# Patient Record
Sex: Female | Born: 1955 | Race: White | Hispanic: No | Smoking: Former smoker
Health system: Southern US, Community
[De-identification: ages and names within clinical notes are randomized; demographics above are authoritative.]

## PROBLEM LIST (undated history)

## (undated) DIAGNOSIS — K5909 Other constipation: Secondary | ICD-10-CM

## (undated) DIAGNOSIS — M159 Polyosteoarthritis, unspecified: Secondary | ICD-10-CM

## (undated) DIAGNOSIS — K219 Gastro-esophageal reflux disease without esophagitis: Secondary | ICD-10-CM

## (undated) DIAGNOSIS — K5790 Diverticulosis of intestine, part unspecified, without perforation or abscess without bleeding: Secondary | ICD-10-CM

## (undated) DIAGNOSIS — I10 Essential (primary) hypertension: Secondary | ICD-10-CM

## (undated) HISTORY — PX: NEPHRECTOMY: SHX65

## (undated) HISTORY — PX: KNEE ARTHROSCOPY: SUR90

## (undated) HISTORY — DX: Essential (primary) hypertension: I10

## (undated) HISTORY — DX: Other constipation: K59.09

## (undated) HISTORY — PX: FOOT SURGERY: SHX648

## (undated) HISTORY — PX: CHOLECYSTECTOMY: SHX55

## (undated) HISTORY — DX: Polyosteoarthritis, unspecified: M15.9

## (undated) HISTORY — DX: Gastro-esophageal reflux disease without esophagitis: K21.9

---

## 2002-09-30 ENCOUNTER — Other Ambulatory Visit: Admission: RE | Admit: 2002-09-30 | Discharge: 2002-09-30 | Payer: Self-pay | Admitting: Internal Medicine

## 2003-10-07 ENCOUNTER — Other Ambulatory Visit: Admission: RE | Admit: 2003-10-07 | Discharge: 2003-10-07 | Payer: Self-pay | Admitting: Internal Medicine

## 2005-11-08 ENCOUNTER — Ambulatory Visit: Payer: Self-pay | Admitting: Internal Medicine

## 2005-11-08 ENCOUNTER — Other Ambulatory Visit: Admission: RE | Admit: 2005-11-08 | Discharge: 2005-11-08 | Payer: Self-pay | Admitting: Internal Medicine

## 2005-11-21 ENCOUNTER — Ambulatory Visit: Payer: Self-pay | Admitting: Gastroenterology

## 2005-12-05 ENCOUNTER — Ambulatory Visit: Payer: Self-pay | Admitting: Gastroenterology

## 2007-06-16 ENCOUNTER — Telehealth (INDEPENDENT_AMBULATORY_CARE_PROVIDER_SITE_OTHER): Payer: Self-pay | Admitting: *Deleted

## 2007-09-25 HISTORY — PX: CHOLECYSTECTOMY: SHX55

## 2015-02-12 ENCOUNTER — Encounter (HOSPITAL_COMMUNITY): Payer: Self-pay | Admitting: *Deleted

## 2015-02-12 DIAGNOSIS — Z8659 Personal history of other mental and behavioral disorders: Secondary | ICD-10-CM | POA: Insufficient documentation

## 2015-02-12 DIAGNOSIS — R143 Flatulence: Secondary | ICD-10-CM | POA: Insufficient documentation

## 2015-02-12 DIAGNOSIS — Z87891 Personal history of nicotine dependence: Secondary | ICD-10-CM | POA: Insufficient documentation

## 2015-02-12 DIAGNOSIS — Z79899 Other long term (current) drug therapy: Secondary | ICD-10-CM | POA: Insufficient documentation

## 2015-02-12 DIAGNOSIS — R1084 Generalized abdominal pain: Secondary | ICD-10-CM | POA: Diagnosis present

## 2015-02-12 DIAGNOSIS — R112 Nausea with vomiting, unspecified: Secondary | ICD-10-CM | POA: Insufficient documentation

## 2015-02-12 DIAGNOSIS — K5900 Constipation, unspecified: Secondary | ICD-10-CM | POA: Insufficient documentation

## 2015-02-12 LAB — CBC WITH DIFFERENTIAL/PLATELET
Basophils Absolute: 0 10*3/uL (ref 0.0–0.1)
Basophils Relative: 0 % (ref 0–1)
Eosinophils Absolute: 0.1 10*3/uL (ref 0.0–0.7)
Eosinophils Relative: 1 % (ref 0–5)
HCT: 42.6 % (ref 36.0–46.0)
HEMOGLOBIN: 14.2 g/dL (ref 12.0–15.0)
Lymphocytes Relative: 17 % (ref 12–46)
Lymphs Abs: 1.5 10*3/uL (ref 0.7–4.0)
MCH: 30.3 pg (ref 26.0–34.0)
MCHC: 33.3 g/dL (ref 30.0–36.0)
MCV: 90.8 fL (ref 78.0–100.0)
Monocytes Absolute: 0.3 10*3/uL (ref 0.1–1.0)
Monocytes Relative: 3 % (ref 3–12)
NEUTROS ABS: 6.9 10*3/uL (ref 1.7–7.7)
NEUTROS PCT: 79 % — AB (ref 43–77)
PLATELETS: 216 10*3/uL (ref 150–400)
RBC: 4.69 MIL/uL (ref 3.87–5.11)
RDW: 12.6 % (ref 11.5–15.5)
WBC: 8.8 10*3/uL (ref 4.0–10.5)

## 2015-02-12 LAB — I-STAT CG4 LACTIC ACID, ED: Lactic Acid, Venous: 1.85 mmol/L (ref 0.5–2.0)

## 2015-02-12 NOTE — ED Notes (Signed)
Pt in c/o upper abd pain, reports LBM was Wednesday, reports n/v that started today

## 2015-02-13 ENCOUNTER — Emergency Department (HOSPITAL_COMMUNITY): Payer: PRIVATE HEALTH INSURANCE

## 2015-02-13 ENCOUNTER — Emergency Department (HOSPITAL_COMMUNITY)
Admission: EM | Admit: 2015-02-13 | Discharge: 2015-02-13 | Disposition: A | Discharge status: Home / Self Care | Payer: PRIVATE HEALTH INSURANCE | Attending: Emergency Medicine | Admitting: Emergency Medicine

## 2015-02-13 DIAGNOSIS — K5900 Constipation, unspecified: Secondary | ICD-10-CM

## 2015-02-13 DIAGNOSIS — R112 Nausea with vomiting, unspecified: Secondary | ICD-10-CM

## 2015-02-13 HISTORY — DX: Diverticulosis of intestine, part unspecified, without perforation or abscess without bleeding: K57.90

## 2015-02-13 LAB — COMPREHENSIVE METABOLIC PANEL
ALBUMIN: 3.8 g/dL (ref 3.5–5.0)
ALK PHOS: 152 U/L — AB (ref 38–126)
ALT: 57 U/L — ABNORMAL HIGH (ref 14–54)
ANION GAP: 12 (ref 5–15)
AST: 80 U/L — ABNORMAL HIGH (ref 15–41)
BUN: 10 mg/dL (ref 6–20)
CO2: 23 mmol/L (ref 22–32)
Calcium: 9.7 mg/dL (ref 8.9–10.3)
Chloride: 102 mmol/L (ref 101–111)
Creatinine, Ser: 0.83 mg/dL (ref 0.44–1.00)
GFR calc non Af Amer: >60 mL/min (ref >60)
GLUCOSE: 128 mg/dL — AB (ref 65–99)
Potassium: 4 mmol/L (ref 3.5–5.1)
Sodium: 137 mmol/L (ref 135–145)
TOTAL PROTEIN: 6.8 g/dL (ref 6.5–8.1)
Total Bilirubin: 0.7 mg/dL (ref 0.3–1.2)

## 2015-02-13 LAB — URINALYSIS, ROUTINE W REFLEX MICROSCOPIC
BILIRUBIN URINE: NEGATIVE
GLUCOSE, UA: NEGATIVE mg/dL
Hgb urine dipstick: NEGATIVE
Ketones, ur: NEGATIVE mg/dL
Leukocytes, UA: NEGATIVE
Nitrite: NEGATIVE
PH: 5.5 (ref 5.0–8.0)
Protein, ur: NEGATIVE mg/dL
Specific Gravity, Urine: 1.009 (ref 1.005–1.030)
UROBILINOGEN UA: 1 mg/dL (ref 0.0–1.0)

## 2015-02-13 LAB — I-STAT TROPONIN, ED: TROPONIN I, POC: 0 ng/mL (ref 0.00–0.08)

## 2015-02-13 LAB — LIPASE, BLOOD: Lipase: 29 U/L (ref 22–51)

## 2015-02-13 MED ORDER — ONDANSETRON 4 MG PO TBDP
4.0000 mg | ORAL_TABLET | Freq: Once | ORAL | Status: AC
  Administered 2015-02-13: 4 mg via ORAL
  Filled 2015-02-13: qty 1

## 2015-02-13 MED ORDER — TRAMADOL HCL 50 MG PO TABS
50.0000 mg | ORAL_TABLET | Freq: Once | ORAL | Status: AC
  Administered 2015-02-13: 50 mg via ORAL
  Filled 2015-02-13: qty 1

## 2015-02-13 MED ORDER — TRAMADOL HCL 50 MG PO TABS: 50.0000 mg | ORAL_TABLET | Freq: Four times a day (QID) | ORAL | Status: DC | PRN

## 2015-02-13 MED ORDER — ONDANSETRON 4 MG PO TBDP: 4.0000 mg | ORAL_TABLET | Freq: Three times a day (TID) | ORAL | Status: DC | PRN

## 2015-02-13 MED ORDER — DOCUSATE SODIUM 100 MG PO CAPS
100.0000 mg | ORAL_CAPSULE | Freq: Once | ORAL | Status: AC
  Administered 2015-02-13: 100 mg via ORAL
  Filled 2015-02-13: qty 1

## 2015-02-13 NOTE — ED Provider Notes (Signed)
TIME SEEN: 12:14 AM   CHIEF COMPLAINT: Abdominal Pain  HPI: HPI Comments: Alicia Wilcox is a 59 y.o. female with PMHx of diverticulosis who presents to the Emergency Department complaining of generalized abdominal pain onset this morning. Pt describes the pain as a pressure that gradually builds up. Pt reports associated nausea, vomiting, belching, flatus and constipation. Pt states that when she belches it smells like feces but has not vomited anything that looks like stool, no coffee-ground emesis, no hematemesis. Pt states that she has not had a normal BM in 3 days. Pt has tried 3 enemas and miralax  with no relief. Pt states that the pain is better when laying flat and is worse when sitting up. Pt denies fever or other related symptoms. Reports Hx or cholecystomy and Nephrectomy. Pt reports drinking alcohol 3-4 days a week. No heavy NSAID use. No bloody stool or melena.   ROS: See HPI Constitutional: no fever  Eyes: no drainage  ENT: no runny nose   Cardiovascular:  no chest pain  Resp: no SOB  GI: Nausea, vomiting, belching, flatus and constipation.  GU: no dysuria Integumentary: no rash  Allergy: no hives  Musculoskeletal: no leg swelling  Neurological: no slurred speech ROS otherwise negative  PAST MEDICAL HISTORY/PAST SURGICAL HISTORY:  Past Medical History  Diagnosis Date  . Diverticulosis   . Depression     MEDICATIONS:  Prior to Admission medications   Not on File    ALLERGIES:  Allergies  Allergen Reactions  . Vicodin [Hydrocodone-Acetaminophen]     SOCIAL HISTORY:  History  Substance Use Topics  . Smoking status: Former Games developer  . Smokeless tobacco: Not on file  . Alcohol Use: Not on file    FAMILY HISTORY: History reviewed. No pertinent family history.  EXAM: BP 131/91 mmHg  Pulse 104  Temp(Src) 98.7 F (37.1 C) (Oral)  Resp 20  Wt 190 lb (86.183 kg)  SpO2 100%   CONSTITUTIONAL: Alert and oriented and responds appropriately to questions.  Well-appearing; well-nourished HEAD: Normocephalic EYES: Conjunctivae clear, PERRL ENT: normal nose; no rhinorrhea; moist mucous membranes; pharynx without lesions noted NECK: Supple, no meningismus, no LAD  CARD: RRR; S1 and S2 appreciated; no murmurs, no clicks, no rubs, no gallops RESP: Normal chest excursion without splinting or tachypnea; breath sounds clear and equal bilaterally; no wheezes, no rhonchi, no rales, no hypoxia or respiratory distress, speaking full sentences ABD/GI: Normal bowel sounds; non-distended; soft, mildly tender to palpation diffusely, no rebound, no guarding, no peritoneal signs. Rectal: non tender rectal exam. No gross blood or melena, normal rectal tone, no fecal impaction.  BACK:  The back appears normal and is non-tender to palpation, there is no CVA tenderness EXT: Normal ROM in all joints; non-tender to palpation; no edema; normal capillary refill; no cyanosis, no calf tenderness or swelling    SKIN: Normal color for age and race; warm NEURO: Moves all extremities equally, sensation to light touch intact diffusely, cranial nerves II through XII intact PSYCH: The patient's mood and manner are appropriate. Grooming and personal hygiene are appropriate.   EKG Interpretation  Date/Time:  Saturday Feb 12 2015 23:30:43 EDT Ventricular Rate:  84 PR Interval:  160 QRS Duration: 80 QT Interval:  362 QTC Calculation: 427 R Axis:   119 Text Interpretation:  Normal sinus rhythm Left posterior fascicular block Cannot rule out Inferior infarct , age undetermined Anterior infarct , age undetermined Abnormal ECG No old tracing to compare Confirmed by Clent Damore,  DO, Brynlynn Walko [phone])  on 02/13/2015 12:27:01 AM       MEDICAL DECISION MAKING: Patient has diffuse mild abdominal pain but is hemodynamically stable. No peritoneal signs. Show mild elevation of her AST, ALT and alkaline phosphatase but she is a regular drinker. Denies Tylenol use. Is status post cholecystectomy. No  significant tenderness in the right upper quadrant. No leukocytosis. Normal lactate. Negative troponin. Acute abdominal series shows no free air or sign of bowel obstruction but she does have constipation. She is not impacted on rectal exam. Have advised her to continue increased fiber intake, water, Colace, MiraLAX. Given dose of tramadol and Zofran in the emergency department she reports feeling much better. We'll discharge with prescription for the same but advised her to use tramadol cautiously as this can worsen her constipation. Discussed return precautions. She verbalized understanding and is comfortable with plan. I do not feel she needs further abdominal imaging today as I have very low suspicion for any life-threatening, emergent process given she is well-appearing with benign exam and normal workup today.    I personally performed the services described in this documentation, which was scribed in my presence. The recorded information has been reviewed and is accurate.   Layla MawKristen N Giulio Bertino, DO 02/13/15 [phone]

## 2015-02-13 NOTE — ED Notes (Signed)
Patient transported to X-ray 

## 2015-02-13 NOTE — Discharge Instructions (Signed)
Your labs today were normal other than slightly elevated liver function tests. I recommend that you have avoid alcohol and acetaminophen (Tylenol) at this time and have this rechecked by primary care physician in the next 1-2 weeks. Your x-ray showed that you're constipated. I recommend you increase your water and fiber intake. If you cannot increase the fiber in your diet I recommend you use Metamucil daily. You may also use MiraLAX once a day and Colace 100 mg tablets twice a day for constipation.   Constipation Constipation is when a person has fewer than three bowel movements a week, has difficulty having a bowel movement, or has stools that are dry, hard, or larger than normal. As people grow older, constipation is more common. If you try to fix constipation with medicines that make you have a bowel movement (laxatives), the problem may get worse. Long-term laxative use may cause the muscles of the colon to become weak. A low-fiber diet, not taking in enough fluids, and taking certain medicines may make constipation worse.  CAUSES   Certain medicines, such as antidepressants, pain medicine, iron supplements, antacids, and water pills.   Certain diseases, such as diabetes, irritable bowel syndrome (IBS), thyroid disease, or depression.   Not drinking enough water.   Not eating enough fiber-rich foods.   Stress or travel.   Lack of physical activity or exercise.   Ignoring the urge to have a bowel movement.   Using laxatives too much.  SIGNS AND SYMPTOMS   Having fewer than three bowel movements a week.   Straining to have a bowel movement.   Having stools that are hard, dry, or larger than normal.   Feeling full or bloated.   Pain in the lower abdomen.   Not feeling relief after having a bowel movement.  DIAGNOSIS  Your health care provider will take a medical history and perform a physical exam. Further testing may be done for severe constipation. Some tests may  include:  A barium enema X-ray to examine your rectum, colon, and, sometimes, your small intestine.   A sigmoidoscopy to examine your lower colon.   A colonoscopy to examine your entire colon. TREATMENT  Treatment will depend on the severity of your constipation and what is causing it. Some dietary treatments include drinking more fluids and eating more fiber-rich foods. Lifestyle treatments may include regular exercise. If these diet and lifestyle recommendations do not help, your health care provider may recommend taking over-the-counter laxative medicines to help you have bowel movements. Prescription medicines may be prescribed if over-the-counter medicines do not work.  HOME CARE INSTRUCTIONS   Eat foods that have a lot of fiber, such as fruits, vegetables, whole grains, and beans.  Limit foods high in fat and processed sugars, such as french fries, hamburgers, cookies, candies, and soda.   A fiber supplement may be added to your diet if you cannot get enough fiber from foods.   Drink enough fluids to keep your urine clear or pale yellow.   Exercise regularly or as directed by your health care provider.   Go to the restroom when you have the urge to go. Do not hold it.   Only take over-the-counter or prescription medicines as directed by your health care provider. Do not take other medicines for constipation without talking to your health care provider first.  SEEK IMMEDIATE MEDICAL CARE IF:   You have bright red blood in your stool.   Your constipation lasts for more than 4 days or gets  worse.   You have abdominal or rectal pain.   You have thin, pencil-like stools.   You have unexplained weight loss. MAKE SURE YOU:   Understand these instructions.  Will watch your condition.  Will get help right away if you are not doing well or get worse. Document Released: 06/08/2004 Document Revised: 09/15/2013 Document Reviewed: 06/22/2013 [facility] Patient  Information 2015 [place], [place]. This information is not intended to replace advice given to you by your health care provider. Make sure you discuss any questions you have with your health care provider.   High-Fiber Diet Fiber is found in fruits, vegetables, and grains. A high-fiber diet encourages the addition of more whole grains, legumes, fruits, and vegetables in your diet. The recommended amount of fiber for adult males is 38 g per day. For adult females, it is 25 g per day. Pregnant and lactating women should get 28 g of fiber per day. If you have a digestive or bowel problem, ask your caregiver for advice before adding high-fiber foods to your diet. Eat a variety of high-fiber foods instead of only a select few type of foods.  PURPOSE  To increase stool bulk.  To make bowel movements more regular to prevent constipation.  To lower cholesterol.  To prevent overeating. WHEN IS THIS DIET USED?  It may be used if you have constipation and hemorrhoids.  It may be used if you have uncomplicated diverticulosis (intestine condition) and irritable bowel syndrome.  It may be used if you need help with weight management.  It may be used if you want to add it to your diet as a protective measure against atherosclerosis, diabetes, and cancer. SOURCES OF FIBER  Whole-grain breads and cereals.  Fruits, such as apples, oranges, bananas, berries, prunes, and pears.  Vegetables, such as green peas, carrots, sweet potatoes, beets, broccoli, cabbage, spinach, and artichokes.  Legumes, such split peas, soy, lentils.  Almonds. FIBER CONTENT IN FOODS Starches and Grains / Dietary Fiber (g)  Cheerios, 1 cup / 3 g  Corn Flakes cereal, 1 cup / 0.7 g  Rice crispy treat cereal, 1 cup / 0.3 g  Instant oatmeal (cooked),  cup / 2 g  Frosted wheat cereal, 1 cup / 5.1 g  Brown, long-grain rice (cooked), 1 cup / 3.5 g  White, long-grain rice (cooked), 1 cup / 0.6 g  Enriched macaroni  (cooked), 1 cup / 2.5 g Legumes / Dietary Fiber (g)  Baked beans (canned, plain, or vegetarian),  cup / 5.2 g  Kidney beans (canned),  cup / 6.8 g  Pinto beans (cooked),  cup / 5.5 g Breads and Crackers / Dietary Fiber (g)  Plain or honey graham crackers, 2 squares / 0.7 g  Saltine crackers, 3 squares / 0.3 g  Plain, salted pretzels, 10 pieces / 1.8 g  Whole-wheat bread, 1 slice / 1.9 g  White bread, 1 slice / 0.7 g  Raisin bread, 1 slice / 1.2 g  Plain bagel, 3 oz / 2 g  Flour tortilla, 1 oz / 0.9 g  Corn tortilla, 1 small / 1.5 g  Hamburger or hotdog bun, 1 small / 0.9 g Fruits / Dietary Fiber (g)  Apple with skin, 1 medium / 4.4 g  Sweetened applesauce,  cup / 1.5 g  Banana,  medium / 1.5 g  Grapes, 10 grapes / 0.4 g  Orange, 1 small / 2.3 g  Raisin, 1.5 oz / 1.6 g  Melon, 1 cup / 1.4 g Vegetables /  Dietary Fiber (g)  Green beans (canned),  cup / 1.3 g  Carrots (cooked),  cup / 2.3 g  Broccoli (cooked),  cup / 2.8 g  Peas (cooked),  cup / 4.4 g  Mashed potatoes,  cup / 1.6 g  Lettuce, 1 cup / 0.5 g  Corn (canned),  cup / 1.6 g  Tomato,  cup / 1.1 g Document Released: 09/10/2005 Document Revised: 03/11/2012 Document Reviewed: 12/13/2011 [employer] Patient Information 2015 [employer]. This information is not intended to replace advice given to you by your health care provider. Make sure you discuss any questions you have with your health care provider.   Nausea and Vomiting Nausea is a sick feeling that often comes before throwing up (vomiting). Vomiting is a reflex where stomach contents come out of your mouth. Vomiting can cause severe loss of body fluids (dehydration). Children and elderly adults can become dehydrated quickly, especially if they also have diarrhea. Nausea and vomiting are symptoms of a condition or disease. It is important to find the cause of your symptoms. CAUSES   Direct irritation of the stomach lining. This  irritation can result from increased acid production (gastroesophageal reflux disease), infection, food poisoning, taking certain medicines (such as nonsteroidal anti-inflammatory drugs), alcohol use, or tobacco use.  Signals from the brain.These signals could be caused by a headache, heat exposure, an inner ear disturbance, increased pressure in the brain from injury, infection, a tumor, or a concussion, pain, emotional stimulus, or metabolic problems.  An obstruction in the gastrointestinal tract (bowel obstruction).  Illnesses such as diabetes, hepatitis, gallbladder problems, appendicitis, kidney problems, cancer, sepsis, atypical symptoms of a heart attack, or eating disorders.  Medical treatments such as chemotherapy and radiation.  Receiving medicine that makes you sleep (general anesthetic) during surgery. DIAGNOSIS Your caregiver may ask for tests to be done if the problems do not improve after a few days. Tests may also be done if symptoms are severe or if the reason for the nausea and vomiting is not clear. Tests may include:  Urine tests.  Blood tests.  Stool tests.  Cultures (to look for evidence of infection).  X-rays or other imaging studies. Test results can help your caregiver make decisions about treatment or the need for additional tests. TREATMENT You need to stay well hydrated. Drink frequently but in small amounts.You may wish to drink water, sports drinks, clear broth, or eat frozen ice pops or gelatin dessert to help stay hydrated.When you eat, eating slowly may help prevent nausea.There are also some antinausea medicines that may help prevent nausea. HOME CARE INSTRUCTIONS   Take all medicine as directed by your caregiver.  If you do not have an appetite, do not force yourself to eat. However, you must continue to drink fluids.  If you have an appetite, eat a normal diet unless your caregiver tells you differently.  Eat a variety of complex carbohydrates  (rice, wheat, potatoes, bread), lean meats, yogurt, fruits, and vegetables.  Avoid high-fat foods because they are more difficult to digest.  Drink enough water and fluids to keep your urine clear or pale yellow.  If you are dehydrated, ask your caregiver for specific rehydration instructions. Signs of dehydration may include:  Severe thirst.  Dry lips and mouth.  Dizziness.  Dark urine.  Decreasing urine frequency and amount.  Confusion.  Rapid breathing or pulse. SEEK IMMEDIATE MEDICAL CARE IF:   You have blood or brown flecks (like coffee grounds) in your vomit.  You have black  or bloody stools.  You have a severe headache or stiff neck.  You are confused.  You have severe abdominal pain.  You have chest pain or trouble breathing.  You do not urinate at least once every 8 hours.  You develop cold or clammy skin.  You continue to vomit for longer than 24 to 48 hours.  You have a fever. MAKE SURE YOU:   Understand these instructions.  Will watch your condition.  Will get help right away if you are not doing well or get worse. Document Released: 09/10/2005 Document Revised: 12/03/2011 Document Reviewed: 02/07/2011 [facility] Patient Information 2015 [place], [place]. This information is not intended to replace advice given to you by your health care provider. Make sure you discuss any questions you have with your health care provider.

## 2016-10-31 ENCOUNTER — Encounter (HOSPITAL_COMMUNITY): Payer: Self-pay | Admitting: Family Medicine

## 2016-10-31 ENCOUNTER — Ambulatory Visit (HOSPITAL_COMMUNITY)
Admission: EM | Admit: 2016-10-31 | Discharge: 2016-10-31 | Disposition: A | Discharge status: Home / Self Care | Payer: PRIVATE HEALTH INSURANCE | Attending: Family Medicine | Admitting: Family Medicine

## 2016-10-31 DIAGNOSIS — R21 Rash and other nonspecific skin eruption: Secondary | ICD-10-CM | POA: Diagnosis not present

## 2016-10-31 DIAGNOSIS — L509 Urticaria, unspecified: Secondary | ICD-10-CM | POA: Diagnosis not present

## 2016-10-31 MED ORDER — PREDNISONE 50 MG PO TABS: ORAL_TABLET | ORAL | 0 refills | Status: DC

## 2016-10-31 MED ORDER — DEXAMETHASONE SODIUM PHOSPHATE 10 MG/ML IJ SOLN
10.0000 mg | Freq: Once | INTRAMUSCULAR | Status: AC
  Administered 2016-10-31: 10 mg via INTRAMUSCULAR

## 2016-10-31 MED ORDER — METHYLPREDNISOLONE SODIUM SUCC 125 MG IJ SOLR
80.0000 mg | Freq: Once | INTRAMUSCULAR | Status: AC
  Administered 2016-10-31: 125 mg via INTRAMUSCULAR

## 2016-10-31 MED ORDER — DEXAMETHASONE SODIUM PHOSPHATE 10 MG/ML IJ SOLN
INTRAMUSCULAR | Status: AC
  Filled 2016-10-31: qty 1

## 2016-10-31 MED ORDER — METHYLPREDNISOLONE SODIUM SUCC 125 MG IJ SOLR
INTRAMUSCULAR | Status: AC
  Filled 2016-10-31: qty 2

## 2016-10-31 NOTE — ED Triage Notes (Signed)
Pt here for rash all over body. Itching and unsure what its from. Possible allergic reaction.

## 2016-10-31 NOTE — ED Provider Notes (Signed)
CSN: [id number]     Arrival date & time 10/31/16  1122 History   First MD Initiated Contact with Patient 10/31/16 1255     Chief Complaint  Patient presents with  . Rash   (Consider location/radiation/quality/duration/timing/severity/associated sxs/prior Treatment) 61 year old female complaining of a highly pruritic papular rash that developed yesterday. She is itching all over. Her rash is primarily to the trunk and proximal lower extremities arms and lesser to the face. She does not know what she may be allergic to that is causing this reaction. Her allergy to hydrocodone is not a true allergy it just causes nausea. She is unaware of any new medications. Denies difficulty in breathing, cough, swelling.      Past Medical History:  Diagnosis Date  . Depression   . Diverticulosis    History reviewed. No pertinent surgical history. History reviewed. No pertinent family history. Social History  Substance Use Topics  . Smoking status: Former Games developermoker  . Smokeless tobacco: Never Used  . Alcohol use Not on file   OB History    No data available     Review of Systems  Constitutional: Negative for activity change and fever.  HENT: Negative.   Eyes: Negative.   Respiratory: Negative for cough, choking, shortness of breath, wheezing and stridor.   Cardiovascular: Negative for chest pain and leg swelling.  Gastrointestinal: Negative.   Skin: Positive for rash.  Neurological: Negative.   All other systems reviewed and are negative.   Allergies  Vicodin [hydrocodone-acetaminophen]  Home Medications   Prior to Admission medications   Medication Sig Start Date End Date Taking? Authorizing Provider  omeprazole (PRILOSEC OTC) 20 MG tablet Take 20 mg by mouth daily.    Historical Provider, MD  ondansetron (ZOFRAN ODT) 4 MG disintegrating tablet Take 1 tablet (4 mg total) by mouth every 8 (eight) hours as needed for nausea or vomiting. 02/13/15   Kristen N Ward, DO  PARoxetine (PAXIL) 40  MG tablet Take 40 mg by mouth daily.    Historical Provider, MD  traMADol (ULTRAM) 50 MG tablet Take 1 tablet (50 mg total) by mouth every 6 (six) hours as needed. 02/13/15   Layla MawKristen N Ward, DO   Meds Ordered and Administered this Visit   Medications  dexamethasone (DECADRON) injection 10 mg (not administered)  methylPREDNISolone sodium succinate (SOLU-MEDROL) 125 mg/2 mL injection 80 mg (not administered)    BP 150/78   Pulse 93   Temp 98.4 F (36.9 C)   Resp 18   SpO2 98%  No data found.   Physical Exam  Constitutional: She is oriented to person, place, and time. She appears well-developed and well-nourished. No distress.  HENT:  Head: Normocephalic and atraumatic.  Nose: Nose normal.  Mouth/Throat: Oropharynx is clear and moist. No oropharyngeal exudate.  Airway widely patent area and no intraoral edema or discoloration.  Eyes: Conjunctivae and EOM are normal. Pupils are equal, round, and reactive to light.  Neck: Neck supple. No tracheal deviation present.  Cardiovascular: Normal rate.   Pulmonary/Chest: Effort normal and breath sounds normal. No respiratory distress. She has no wheezes. She has no rales.  Musculoskeletal: Normal range of motion. She exhibits no edema or tenderness.  Lymphadenopathy:    She has no cervical adenopathy.  Neurological: She is alert and oriented to person, place, and time. No cranial nerve deficit.  Skin: Skin is warm and dry. No rash noted.  Primarily papular rash to the torso, upper extremities and proximal lower extremities. Densely populated to  the trunk. Few areas that appear more like urticaria.  Psychiatric: She has a normal mood and affect. Her behavior is normal. Thought content normal.  Nursing note and vitals reviewed.   Urgent Care Course     Procedures (including critical care time)  Labs Review Labs Reviewed - No data to display  Imaging Review No results found.   Visual Acuity Review  Right Eye Distance:   Left Eye  Distance:   Bilateral Distance:    Right Eye Near:   Left Eye Near:    Bilateral Near:         MDM   1. Rash   2. Hives    The rash certainly appears to be a type of allergic reaction. Remainder of your exam is normal. He had received injections of a short and long-acting steroid in the urgent care. He will receive a prescription for some oral prednisone to start taking tomorrow once daily for the next few days. Also she can take Benadryl in the evening. Otherwise take Zantac 150 mg twice a day and during the day he may also take Zyrtec or Allegra which are nondrowsy antihistamines. If he develops swelling of the tongue, throat, wheezing, trouble breathing, severe cough, fever or other problems this could represent an emergency and you would need to call 911 or go to the emergency department. Meds ordered this encounter  Medications  . dexamethasone (DECADRON) injection 10 mg  . methylPREDNISolone sodium succinate (SOLU-MEDROL) 125 mg/2 mL injection 80 mg  . predniSONE (DELTASONE) 50 MG tablet    Sig: 1 tab po daily for 6 days. Take with food.    Dispense:  6 tablet    Refill:  0    Order Specific Question:   Supervising Provider    Answer:   Lonia Blood       Hayden Rasmussen, NP 10/31/16 1316

## 2016-10-31 NOTE — Discharge Instructions (Signed)
The rash certainly appears to be a type of allergic reaction. Remainder of your exam is normal. He had received injections of a short and long-acting steroid in the urgent care. He will receive a prescription for some oral prednisone to start taking tomorrow once daily for the next few days. Also she can take Benadryl in the evening. Otherwise take Zantac 150 mg twice a day and during the day he may also take Zyrtec or Allegra which are nondrowsy antihistamines. If he develops swelling of the tongue, throat, wheezing, trouble breathing, severe cough, fever or other problems this could represent an emergency and you would need to call 911 or go to the emergency department.

## 2018-12-13 ENCOUNTER — Other Ambulatory Visit: Payer: Self-pay

## 2018-12-13 ENCOUNTER — Encounter (HOSPITAL_COMMUNITY): Payer: Self-pay | Admitting: *Deleted

## 2018-12-13 ENCOUNTER — Telehealth: Payer: Managed Care, Other (non HMO) | Admitting: Physician Assistant

## 2018-12-13 ENCOUNTER — Ambulatory Visit (HOSPITAL_COMMUNITY)
Admission: EM | Admit: 2018-12-13 | Discharge: 2018-12-13 | Disposition: A | Discharge status: Home / Self Care | Payer: Managed Care, Other (non HMO) | Attending: Physician Assistant | Admitting: Physician Assistant

## 2018-12-13 DIAGNOSIS — R21 Rash and other nonspecific skin eruption: Secondary | ICD-10-CM

## 2018-12-13 LAB — CBC
HEMATOCRIT: 40.6 % (ref 36.0–46.0)
HEMOGLOBIN: 13.2 g/dL (ref 12.0–15.0)
MCH: 29.5 pg (ref 26.0–34.0)
MCHC: 32.5 g/dL (ref 30.0–36.0)
MCV: 90.8 fL (ref 80.0–100.0)
NRBC: 0 % (ref 0.0–0.2)
Platelets: 223 10*3/uL (ref 150–400)
RBC: 4.47 MIL/uL (ref 3.87–5.11)
RDW: 11.7 % (ref 11.5–15.5)
WBC: 7 10*3/uL (ref 4.0–10.5)

## 2018-12-13 LAB — COMPREHENSIVE METABOLIC PANEL
ALK PHOS: 139 U/L — AB (ref 38–126)
ALT: 30 U/L (ref 0–44)
AST: 37 U/L (ref 15–41)
Albumin: 4.3 g/dL (ref 3.5–5.0)
Anion gap: 9 (ref 5–15)
BILIRUBIN TOTAL: 0.6 mg/dL (ref 0.3–1.2)
BUN: 12 mg/dL (ref 8–23)
CALCIUM: 9.8 mg/dL (ref 8.9–10.3)
CO2: 25 mmol/L (ref 22–32)
CREATININE: 1.03 mg/dL — AB (ref 0.44–1.00)
Chloride: 104 mmol/L (ref 98–111)
GFR calc Af Amer: >60 mL/min (ref >60)
GFR calc non Af Amer: 57 mL/min — ABNORMAL LOW (ref >60)
GLUCOSE: 107 mg/dL — AB (ref 70–99)
POTASSIUM: 4 mmol/L (ref 3.5–5.1)
Sodium: 138 mmol/L (ref 135–145)
TOTAL PROTEIN: 7.5 g/dL (ref 6.5–8.1)

## 2018-12-13 MED ORDER — AMLODIPINE BESYLATE 2.5 MG PO TABS: 2.5000 mg | ORAL_TABLET | Freq: Every day | ORAL | 0 refills | Status: DC

## 2018-12-13 MED ORDER — CLINDAMYCIN PHOSPHATE 1 % EX GEL: Freq: Two times a day (BID) | CUTANEOUS | 0 refills | Status: DC

## 2018-12-13 MED ORDER — TERBINAFINE HCL 250 MG PO TABS: 250.0000 mg | ORAL_TABLET | Freq: Every day | ORAL | 0 refills | Status: DC

## 2018-12-13 NOTE — ED Triage Notes (Signed)
C/O various sized red lesions to torso x 1 month.  Has tried mutliple OTC remedies without relief.

## 2018-12-13 NOTE — Discharge Instructions (Addendum)
I think this is either fungal or a bacteria that causes erythrasma. Start the fungal meds and cream. Follow up with Dr. Duard Larsen when you can.  I will call you if there is any major problems with your labs.

## 2018-12-13 NOTE — ED Provider Notes (Signed)
12/13/2018 4:34 PM   DOB: 08/08/1956 / MRN: [id number]  SUBJECTIVE:  Alicia Wilcox is a 63 y.o. female presenting for "mildly burning rash" that started about 2 months ago. No new meds.     She is allergic to vicodin [hydrocodone-acetaminophen].   She  has a past medical history of Diverticulosis.    She  reports that she has quit smoking. She has never used smokeless tobacco. She reports current alcohol use of about 28.0 standard drinks of alcohol per week. She  has no history on file for sexual activity. The patient  has a past surgical history that includes Nephrectomy; Knee arthroscopy; Cholecystectomy; and Foot surgery.  Her family history includes COPD in her mother; Cancer in her sister; Heart failure in her mother.  Review of Systems  Constitutional: Negative for fever.  HENT: Negative for sore throat.   Respiratory: Negative for cough and wheezing.   Cardiovascular: Negative for chest pain.  Gastrointestinal: Negative for nausea.  Musculoskeletal: Negative for myalgias.    OBJECTIVE:  BP (!) 172/78   Pulse 99   Temp (!) 97.4 F (36.3 C) (Oral)   Resp 16   SpO2 94%   Wt Readings from Last 3 Encounters:  02/12/15 190 lb (86.2 kg)   Temp Readings from Last 3 Encounters:  12/13/18 (!) 97.4 F (36.3 C) (Oral)  10/31/16 98.4 F (36.9 C)  02/13/15 99.1 F (37.3 C) (Oral)   BP Readings from Last 3 Encounters:  12/13/18 (!) 172/78  10/31/16 150/78  02/13/15 123/69   Pulse Readings from Last 3 Encounters:  12/13/18 99  10/31/16 93  02/13/15 78    Physical Exam Vitals signs reviewed.  Constitutional:      General: She is not in acute distress.    Appearance: She is not diaphoretic.  Eyes:     Pupils: Pupils are equal, round, and reactive to light.  Cardiovascular:     Rate and Rhythm: Normal rate.  Pulmonary:     Effort: Pulmonary effort is normal.  Abdominal:     General: There is no distension.  Skin:    General: Skin is dry.       Neurological:      Mental Status: She is alert and oriented to person, place, and time.     Cranial Nerves: No cranial nerve deficit.     Gait: Gait normal.     No results found for this or any previous visit (from the past 72 hour(s)).  No results found.  ASSESSMENT AND PLAN:   Rash and nonspecific skin eruption    Discharge Instructions     I think this is either fungal or a bacteria that causes erythrasma. Start the fungal meds and cream. Follow up with Dr. Duard Larsen when you can.  I will call you if there is any major problems with your labs.         The patient is advised to call or return to clinic if she does not see an improvement in symptoms, or to seek the care of the closest emergency department if she worsens with the above plan.   Deliah Boston, MHS, PA-C 12/13/2018 4:34 PM   Alicia Neas, PA-C 12/13/18 [phone]

## 2018-12-13 NOTE — Progress Notes (Signed)
Based on what you shared with me, I feel your condition warrants further evaluation and I recommend that you be seen for a face to face office visit. Or be seen via video visit so that the rash can be examined.  It is hard to delineate what the rash is, and therefore give proper treatment. It does not sound like an allergic reaction. It could be a fungal dermatitis or a contact dermatitis but very hard to tell without seeing it. Also giving how long it has been present, this really warrants a in-person examination.    NOTE: If you entered your credit card information for this eVisit, you will not be charged. You may see a "hold" on your card for the $35 but that hold will drop off and you will not have a charge processed.  If you are having a true medical emergency please call 911.  If you need an urgent face to face visit, [facility] has four urgent care centers for your convenience.    PLEASE NOTE: THE [facility] LOCATIONS AND URGENT CARE CLINICS DO NOT HAVE THE TESTING FOR CORONAVIRUS COVID19 AVAILABLE.  IF YOU FEEL YOU NEED THIS TEST YOU MUST HAVE AN ORDER TO GO TO A TESTING LOCATION FROM YOUR PROVIDER OR FROM A SCREENING E-VISIT     [email or web address] to reserve your spot online an avoid wait times  [facility] [address], [address] [place], [place] [address] 8 am to 8 pm Monday-Friday 10 am to 4 pm Saturday-Sunday *Across the street from [employer]  [address] [place], [address] 8 am to 5 pm Monday-Friday * In the [facility] on the [facility]   The following sites will take your insurance:  . [facility]  [phone] Get Driving Directions Find a Provider at this Location  [address] [place], [place] [address] . 10 am to 8 pm Monday-Friday . 12 pm to 8 pm Saturday-Sunday   . [facility] at [facility]  [phone] Get Driving Directions Find a  Provider at this Location  [address] [place], [place] [address] . 8 am to 8 pm Monday-Friday . 9 am to 6 pm Saturday . 11 am to 6 pm Sunday   . [facility] at [facility]  [phone] Get Driving Directions  [address] [place], [place] [address] . 8 am to 8 pm Monday-Friday . 8 am to 4 pm Saturday-Sunday   Your e-visit answers were reviewed by a board certified advanced clinical practitioner to complete your personal care plan.  Thank you for using e-Visits.

## 2018-12-15 ENCOUNTER — Telehealth (HOSPITAL_COMMUNITY): Payer: Self-pay | Admitting: Emergency Medicine

## 2018-12-15 NOTE — Telephone Encounter (Signed)
Attempted call to give normal results; no answer

## 2019-03-30 ENCOUNTER — Telehealth: Payer: Self-pay | Admitting: Internal Medicine

## 2019-03-30 NOTE — Telephone Encounter (Signed)
error 

## 2019-08-24 ENCOUNTER — Other Ambulatory Visit: Payer: Self-pay

## 2019-08-24 ENCOUNTER — Ambulatory Visit: Payer: Managed Care, Other (non HMO) | Admitting: Internal Medicine

## 2019-08-24 ENCOUNTER — Encounter: Payer: Self-pay | Admitting: Internal Medicine

## 2019-08-24 ENCOUNTER — Ambulatory Visit: Payer: Medicare HMO | Admitting: Internal Medicine

## 2019-08-24 VITALS — BP 122/86 | HR 82 | Temp 97.9°F | Ht 67.5 in | Wt 193.0 lb

## 2019-08-24 DIAGNOSIS — Z1211 Encounter for screening for malignant neoplasm of colon: Secondary | ICD-10-CM

## 2019-08-24 DIAGNOSIS — K219 Gastro-esophageal reflux disease without esophagitis: Secondary | ICD-10-CM | POA: Diagnosis not present

## 2019-08-24 DIAGNOSIS — Z23 Encounter for immunization: Secondary | ICD-10-CM

## 2019-08-24 DIAGNOSIS — I10 Essential (primary) hypertension: Secondary | ICD-10-CM | POA: Diagnosis not present

## 2019-08-24 DIAGNOSIS — Z0000 Encounter for general adult medical examination without abnormal findings: Secondary | ICD-10-CM

## 2019-08-24 DIAGNOSIS — G479 Sleep disorder, unspecified: Secondary | ICD-10-CM | POA: Diagnosis not present

## 2019-08-24 DIAGNOSIS — M159 Polyosteoarthritis, unspecified: Secondary | ICD-10-CM | POA: Diagnosis not present

## 2019-08-24 MED ORDER — AMLODIPINE BESYLATE 2.5 MG PO TABS: 2.5000 mg | ORAL_TABLET | Freq: Every day | ORAL | 3 refills | Status: DC

## 2019-08-24 NOTE — Assessment & Plan Note (Signed)
Uses aleve bid  Tylenol prn also

## 2019-08-24 NOTE — Addendum Note (Signed)
Addended by: Pilar Grammes on: 08/24/2019 04:26 PM   Modules accepted: Orders
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
## 2019-08-24 NOTE — Assessment & Plan Note (Signed)
Will need mammogram Will do last PAP at next visit prevnar today FIT shingrix at the pharmacy

## 2019-08-24 NOTE — Assessment & Plan Note (Signed)
Controlled with omeprazole bid

## 2019-08-24 NOTE — Assessment & Plan Note (Signed)
Doesn't sleep well Discussed stopping the diphenhydramine Limit tequila---and don't take within 2-3 hours of bedtime

## 2019-08-24 NOTE — Assessment & Plan Note (Signed)
BP Readings from Last 3 Encounters:  08/24/19 122/86  12/13/18 (!) 172/78  10/31/16 150/78   Good control with the low dose amlodipine

## 2019-08-25 ENCOUNTER — Other Ambulatory Visit (INDEPENDENT_AMBULATORY_CARE_PROVIDER_SITE_OTHER): Payer: Medicare HMO

## 2019-08-25 DIAGNOSIS — Z1211 Encounter for screening for malignant neoplasm of colon: Secondary | ICD-10-CM

## 2019-08-25 LAB — FECAL OCCULT BLOOD, IMMUNOCHEMICAL: Fecal Occult Bld: NEGATIVE

## 2020-03-01 ENCOUNTER — Other Ambulatory Visit: Payer: Self-pay | Admitting: Internal Medicine

## 2020-03-01 ENCOUNTER — Encounter: Payer: Self-pay | Admitting: Internal Medicine

## 2020-03-01 ENCOUNTER — Ambulatory Visit (INDEPENDENT_AMBULATORY_CARE_PROVIDER_SITE_OTHER)
Admission: RE | Admit: 2020-03-01 | Discharge: 2020-03-01 | Disposition: A | Discharge status: Home / Self Care | Payer: Medicare HMO | Source: Ambulatory Visit | Attending: Internal Medicine | Admitting: Internal Medicine

## 2020-03-01 ENCOUNTER — Other Ambulatory Visit (HOSPITAL_COMMUNITY)
Admission: RE | Admit: 2020-03-01 | Discharge: 2020-03-01 | Disposition: A | Discharge status: Home / Self Care | Payer: Medicare HMO | Source: Ambulatory Visit | Attending: Internal Medicine | Admitting: Internal Medicine

## 2020-03-01 ENCOUNTER — Other Ambulatory Visit: Payer: Self-pay

## 2020-03-01 ENCOUNTER — Ambulatory Visit (INDEPENDENT_AMBULATORY_CARE_PROVIDER_SITE_OTHER): Payer: Medicare HMO | Admitting: Internal Medicine

## 2020-03-01 VITALS — BP 128/84 | HR 82 | Temp 97.7°F | Ht 67.5 in | Wt 199.0 lb

## 2020-03-01 DIAGNOSIS — R0609 Other forms of dyspnea: Secondary | ICD-10-CM

## 2020-03-01 DIAGNOSIS — Z780 Asymptomatic menopausal state: Secondary | ICD-10-CM | POA: Diagnosis not present

## 2020-03-01 DIAGNOSIS — Z1231 Encounter for screening mammogram for malignant neoplasm of breast: Secondary | ICD-10-CM

## 2020-03-01 DIAGNOSIS — R0600 Dyspnea, unspecified: Secondary | ICD-10-CM

## 2020-03-01 DIAGNOSIS — Z87891 Personal history of nicotine dependence: Secondary | ICD-10-CM

## 2020-03-01 DIAGNOSIS — Z01419 Encounter for gynecological examination (general) (routine) without abnormal findings: Secondary | ICD-10-CM | POA: Diagnosis present

## 2020-03-01 DIAGNOSIS — Z1151 Encounter for screening for human papillomavirus (HPV): Secondary | ICD-10-CM | POA: Insufficient documentation

## 2020-03-01 DIAGNOSIS — K219 Gastro-esophageal reflux disease without esophagitis: Secondary | ICD-10-CM | POA: Diagnosis not present

## 2020-03-01 DIAGNOSIS — I10 Essential (primary) hypertension: Secondary | ICD-10-CM | POA: Diagnosis not present

## 2020-03-01 DIAGNOSIS — Z0000 Encounter for general adult medical examination without abnormal findings: Secondary | ICD-10-CM

## 2020-03-01 DIAGNOSIS — J449 Chronic obstructive pulmonary disease, unspecified: Secondary | ICD-10-CM | POA: Insufficient documentation

## 2020-03-01 DIAGNOSIS — Z7189 Other specified counseling: Secondary | ICD-10-CM

## 2020-03-01 DIAGNOSIS — M159 Polyosteoarthritis, unspecified: Secondary | ICD-10-CM | POA: Diagnosis not present

## 2020-03-01 LAB — COMPREHENSIVE METABOLIC PANEL
ALT: 29 U/L (ref 0–35)
AST: 35 U/L (ref 0–37)
Albumin: 4.2 g/dL (ref 3.5–5.2)
Alkaline Phosphatase: 98 U/L (ref 39–117)
BUN: 10 mg/dL (ref 6–23)
CO2: 31 mEq/L (ref 19–32)
Calcium: 9.5 mg/dL (ref 8.4–10.5)
Chloride: 101 mEq/L (ref 96–112)
Creatinine, Ser: 0.9 mg/dL (ref 0.40–1.20)
GFR: 62.65 mL/min (ref >60.00)
Glucose, Bld: 109 mg/dL — ABNORMAL HIGH (ref 70–99)
Potassium: 4.2 mEq/L (ref 3.5–5.1)
Sodium: 139 mEq/L (ref 135–145)
Total Bilirubin: 0.6 mg/dL (ref 0.2–1.2)
Total Protein: 6.8 g/dL (ref 6.0–8.3)

## 2020-03-01 LAB — CBC
HCT: 40.8 % (ref 36.0–46.0)
Hemoglobin: 13.7 g/dL (ref 12.0–15.0)
MCHC: 33.5 g/dL (ref 30.0–36.0)
MCV: 93.3 fl (ref 78.0–100.0)
Platelets: 194 10*3/uL (ref 150.0–400.0)
RBC: 4.38 Mil/uL (ref 3.87–5.11)
RDW: 12.7 % (ref 11.5–15.5)
WBC: 5.9 10*3/uL (ref 4.0–10.5)

## 2020-03-01 LAB — T4, FREE: Free T4: 0.97 ng/dL (ref 0.60–1.60)

## 2020-03-01 NOTE — Progress Notes (Signed)
Subjective:    Patient ID: Alicia Wilcox, female    DOB: 01-28-1956, 64 y.o.   MRN: [id number]  HPI Here for initial Medicare wellness visit and follow up of chronic health conditions This visit occurred during the SARS-CoV-2 public health emergency.  Safety protocols were in place, including screening questions prior to the visit, additional usage of staff PPE, and extensive cleaning of exam room while observing appropriate contact time as indicated for disinfecting solutions.   Reviewed advanced directives No hospitalizations or surgery in the past year Reviewed other doctors---[facility] for eyes No tobacco now--quit some years ago Still enjoys tequila--in mixed drinks (2 a night) Vision is okay Hearing is fine No falls No depression ---upset with herself about her poor fitness. Not anhedonic Independent with instrumental ADLs No memory issues  Does note some DOE when walking dog---she relates to weight she gained since retiring (2 big boxers) Will wheeze a bit---better with rest Smoked for 40 years---quit 2008 No regular cough--other than with allergies (does use loratadine) No chest pain No palpitations No dizziness or syncope No edema  Continues on amlodipine for HTN Occasionally takes it herself---fine then  Still on omeprazole  Uses it once a day--but takes evening dose just as needed No dysphagia  Ongoing knee, hand and left shoulder arthritis pain Salon pas lidocaine on shoulder at night/biofreeze for this Takes naproxen 2 bid still (unable to wean)  Current Outpatient Medications on File Prior to Visit  Medication Sig Dispense Refill  . amLODipine (NORVASC) 2.5 MG tablet Take 1 tablet (2.5 mg total) by mouth daily. 90 tablet 3  . diphenhydrAMINE HCl, Sleep, (SLEEP AID, DIPHENHYDRAMINE, PO) Take by mouth.    Mariane Baumgarten Calcium (STOOL SOFTENER PO) Take by mouth.    . MELATONIN PO Take by mouth. 5mg     . Naproxen Sodium (ALEVE PO) Take by mouth.    Marland Kitchen  omeprazole (PRILOSEC OTC) 20 MG tablet Take 20 mg by mouth 2 (two) times daily.     . polyethylene glycol (MIRALAX / GLYCOLAX) 17 g packet Take 17 g by mouth daily.     No current facility-administered medications on file prior to visit.    Allergies  Allergen Reactions  . Vicodin [Hydrocodone-Acetaminophen] Other (See Comments)    vomiting    Past Medical History:  Diagnosis Date  . Chronic constipation   . Diverticulosis   . Generalized osteoarthritis of multiple sites    knees and left shoulder especially  . GERD (gastroesophageal reflux disease)   . Hypertension     Past Surgical History:  Procedure Laterality Date  . CHOLECYSTECTOMY  2009  . KNEE ARTHROSCOPY Right 1980's  . NEPHRECTOMY     donated kidney    Family History  Problem Relation Age of Onset  . COPD Mother   . Heart failure Mother   . Congestive Heart Failure Mother   . Breast cancer Sister   . Diabetes Sister   . Diabetes Maternal Aunt     Social History   Socioeconomic History  . Marital status: Divorced    Spouse name: Not on file  . Number of children: 2  . Years of education: Not on file  . Highest education level: Not on file  Occupational History  . Occupation: Audiological scientist    Comment: Retired  Tobacco Use  . Smoking status: Former Research scientist (life sciences)  . Smokeless tobacco: Never Used  Substance and Sexual Activity  . Alcohol use: Yes    Comment: regular tequila-- 4/night  .  Drug use: Not on file  . Sexual activity: Not on file  Other Topics Concern  . Not on file  Social History Narrative   No living will   Sister or mother would be medical decision maker   Would accept resuscitation   Wouldn't want tube feeds if cognitively unaware   Social Determinants of Health   Financial Resource Strain:   . Difficulty of Paying Living Expenses:   Food Insecurity:   . Worried About Programme researcher, broadcasting/film/video in the Last Year:   . Barista in the Last Year:   Transportation Needs:   . Automotive engineer (Medical):   Marland Kitchen Lack of Transportation (Non-Medical):   Physical Activity:   . Days of Exercise per Week:   . Minutes of Exercise per Session:   Stress:   . Feeling of Stress :   Social Connections:   . Frequency of Communication with Friends and Family:   . Frequency of Social Gatherings with Friends and Family:   . Attends Religious Services:   . Active Member of Clubs or Organizations:   . Attends Banker Meetings:   Marland Kitchen Marital Status:   Intimate Partner Violence:   . Fear of Current or Ex-Partner:   . Emotionally Abused:   Marland Kitchen Physically Abused:   . 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 No blood Still has splotchy redness on trunk--for years. No suspicious lesions    Objective:   Physical Exam  Constitutional: She is oriented to person, place, and time. She appears well-developed. No distress.  HENT:  Mouth/Throat: Oropharynx is clear and moist.  No oral lesions  Neck: No thyromegaly present.  Cardiovascular: Normal rate, regular rhythm, normal heart sounds and intact distal pulses. Exam reveals no gallop.  No murmur heard. Respiratory: Effort normal. No respiratory distress. She has no wheezes. She has no rales.  Decreased breath sounds but clear  GI: Soft. There is no abdominal tenderness.  Genitourinary:    Genitourinary Comments: Normal introitus and cervix Pap done   Musculoskeletal:        General: No tenderness or edema.  Lymphadenopathy:    She has no cervical adenopathy.  Neurological: She is alert and oriented to person, place, and time.  President--- "Oleta Mouse---- Clinton, no Obama" [phone] [id number] Recall 2/3  Skin: No erythema.  Psychiatric: She has a normal mood and affect. Her behavior  is normal.           Assessment & Plan:

## 2020-03-01 NOTE — Assessment & Plan Note (Signed)
Controlled on daily omeprazole and occasionally bid

## 2020-03-01 NOTE — Assessment & Plan Note (Addendum)
I suspect this is lung related Will check labs EKG---sinus at 72. Normal axis, non specific IVCD. No hypertrophy or ischemia. No sig change since 02/12/15  CXR--looks normal. Awaiting overread  Will proceed with pulmonary evaluation. May need to consider cardiac testing as well

## 2020-03-01 NOTE — Progress Notes (Signed)
Hearing Screening   125Hz  250Hz  500Hz  1000Hz  2000Hz  3000Hz  4000Hz  6000Hz  8000Hz   Right ear:   20 40 20  20    Left ear:   20 20 20  20       Visual Acuity Screening   Right eye Left eye Both eyes  Without correction:     With correction: 20/13 20/15 20/13

## 2020-03-01 NOTE — Assessment & Plan Note (Signed)
BP Readings from Last 3 Encounters:  03/01/20 128/84  08/24/19 122/86  12/13/18 (!) 172/78   Good control on amlodipine

## 2020-03-01 NOTE — Assessment & Plan Note (Signed)
Takes the naproxen daily with good results Also lidocaine patch for shoulder at night and biofreeze

## 2020-03-01 NOTE — Assessment & Plan Note (Signed)
I have personally reviewed the Medicare Annual Wellness questionnaire and have noted 1. The patient's medical and social history 2. Their use of alcohol, tobacco or illicit drugs 3. Their current medications and supplements 4. The patient's functional ability including ADL's, fall risks, home safety risks and hearing or visual             impairment. 5. Diet and physical activities 6. Evidence for depression or mood disorders  The patients weight, height, BMI and visual acuity have been recorded in the chart I have made referrals, counseling and provided education to the patient based review of the above and I have provided the pt with a written personalized care plan for preventive services.  I have provided you with a copy of your personalized plan for preventive services. Please take the time to review along with your updated medication list.  Pap done today She will set up mammogram FIT--but not due yet Discussed exercise Will refer for lung cancer screening Pneumovax next year Td if injury Flu vaccine in fall

## 2020-03-01 NOTE — Patient Instructions (Signed)
Please set up your screening mammogram. 

## 2020-03-01 NOTE — Assessment & Plan Note (Signed)
See social history 

## 2020-03-02 LAB — CYTOLOGY - PAP
Comment: NEGATIVE
Diagnosis: NEGATIVE
Diagnosis: REACTIVE
High risk HPV: NEGATIVE

## 2020-03-08 ENCOUNTER — Other Ambulatory Visit: Payer: Self-pay

## 2020-03-08 ENCOUNTER — Ambulatory Visit
Admission: RE | Admit: 2020-03-08 | Discharge: 2020-03-08 | Disposition: A | Discharge status: Home / Self Care | Payer: Medicare HMO | Source: Ambulatory Visit | Attending: Internal Medicine | Admitting: Internal Medicine

## 2020-03-08 DIAGNOSIS — Z1231 Encounter for screening mammogram for malignant neoplasm of breast: Secondary | ICD-10-CM

## 2020-03-09 ENCOUNTER — Telehealth: Payer: Self-pay | Admitting: Acute Care

## 2020-03-10 NOTE — Telephone Encounter (Signed)
Pt was scheduled for consult with  Dr. Delton Coombes 7/26 at 2:15. Nothing further needed.

## 2020-04-18 ENCOUNTER — Other Ambulatory Visit: Payer: Self-pay

## 2020-04-18 ENCOUNTER — Encounter: Payer: Self-pay | Admitting: Pulmonary Disease

## 2020-04-18 ENCOUNTER — Institutional Professional Consult (permissible substitution): Payer: Medicare HMO | Admitting: Emergency Medicine

## 2020-04-18 ENCOUNTER — Ambulatory Visit: Payer: Medicare HMO | Admitting: Pulmonary Disease

## 2020-04-18 VITALS — BP 110/68 | HR 99 | Temp 98.0°F | Ht 67.5 in | Wt 195.0 lb

## 2020-04-18 DIAGNOSIS — R0600 Dyspnea, unspecified: Secondary | ICD-10-CM | POA: Diagnosis not present

## 2020-04-18 DIAGNOSIS — F17201 Nicotine dependence, unspecified, in remission: Secondary | ICD-10-CM | POA: Diagnosis not present

## 2020-04-18 DIAGNOSIS — R0609 Other forms of dyspnea: Secondary | ICD-10-CM

## 2020-04-18 DIAGNOSIS — Z87891 Personal history of nicotine dependence: Secondary | ICD-10-CM

## 2020-04-18 MED ORDER — INCRUSE ELLIPTA 62.5 MCG/INH IN AEPB: 1.0000 | INHALATION_SPRAY | Freq: Every day | RESPIRATORY_TRACT | 11 refills | Status: DC

## 2020-04-18 NOTE — Patient Instructions (Addendum)
Nice to meet you!  Lets try Incruse 1 puff daily. I suspect you have COPD given the smoking history. This will treat COPD related shortness of breath. We will need breathing tests to formally diagnose this.   We discussed lung cancer screening. You opted not to pursue screening at this time.   We will see you in 3 months with breathing tests.

## 2020-04-18 NOTE — Progress Notes (Signed)
Patient ID: Alicia Wilcox, female    DOB: [birth year], 64 y.o.   MRN: [id number]  Chief Complaint  Patient presents with   Consult    shortness of breath when walking, no wheezing, coughing but also a former smoker    Referring provider: Karie Schwalbe, MD  HPI:  Alicia Wilcox is a 64 year old with PMH of tobacco abuse (130 pack year history, quit 2008) whom we are seeing in consultation at the request of Dr. Alphonsus Sias for evaluation of dyspnea on exertion.   She has noted DOE with yard work for the past 1 year. She notes it is worse in the heat this summer. She mows and weed eats. She needs to stop after a while to rest. Her DOE gradually resolves. She has had episodes of pre-syncope at times with the DOE. No SOB at rest. Does ok walking around for errands mostly on flat surfaces. Notes she is most often with her 70 year old mom who walks slow. Has not tried inhalers. Reviewed PCP note and reviewed CXR 03/01/2020 with my interpretation as follows: Clear lungs, mild hypoinflation noted on lateral film.  She denies any significant cough. Denies significant seasonal allergies. No chest pain or N/V with exertion. No LE swelling. Denies h/o child hood asthma or asthma as younger adult. She smoked 4 packs a day for ~36 years. She is up to date on COVID vaccines. She notes that her O2 sat was 95% walking from the waiting room which is lower than she recalls in the past.  She would also like to discuss lung cancer screening this visit.  PMH: HTN Surgical history: tubal ligation, R knee arthroscopy Family History: mom has COPD, CHF (EF 20-25%) Social History: retired Educational psychologist and critical care transport, lives with mom (90) and is her caregiver   Imaging: Personally Reviewed and as per discussion in this note  Lab Results: Personally reviewed CBC    Component Value Date/Time   WBC 5.9 03/01/2020 0909   RBC 4.38 03/01/2020 0909   HGB 13.7 03/01/2020 0909   HCT 40.8 03/01/2020 0909   PLT  194.0 03/01/2020 0909   MCV 93.3 03/01/2020 0909   MCH 29.5 12/13/2018 1610   MCHC 33.5 03/01/2020 0909   RDW 12.7 03/01/2020 0909   LYMPHSABS 1.5 02/12/2015 2330   MONOABS 0.3 02/12/2015 2330   EOSABS 0.1 02/12/2015 2330   BASOSABS 0.0 02/12/2015 2330    BMET    Component Value Date/Time   NA 139 03/01/2020 0909   K 4.2 03/01/2020 0909   CL 101 03/01/2020 0909   CO2 31 03/01/2020 0909   GLUCOSE 109 (H) 03/01/2020 0909   BUN 10 03/01/2020 0909   CREATININE 0.90 03/01/2020 0909   CALCIUM 9.5 03/01/2020 0909   GFRNONAA 57 (L) 12/13/2018 1610   GFRAA >60 12/13/2018 1610    BNP No results found for: BNP  ProBNP No results found for: PROBNP  Specialty Problems      Pulmonary Problems   DOE (dyspnea on exertion)      Allergies  Allergen Reactions   Vicodin [Hydrocodone-Acetaminophen] Other (See Comments)    vomiting    Immunization History  Administered Date(s) Administered   Influenza, High Dose Seasonal PF 06/10/2019   Moderna SARS-COVID-2 Vaccination 11/19/2019, 12/21/2019   Pneumococcal Conjugate-13 08/24/2019    Past Medical History:  Diagnosis Date   Chronic constipation    Diverticulosis    Generalized osteoarthritis of multiple sites    knees and left shoulder  especially   GERD (gastroesophageal reflux disease)    Hypertension     Tobacco History: Social History   Tobacco Use  Smoking Status Former Smoker   Packs/day: 4.00   Years: 34.00   Pack years: 136.00   Quit date: 2008   Years since quitting: 13.5  Smokeless Tobacco Never Used   Counseling given: Not Answered   Continue to not smoke  Outpatient Encounter Medications as of 04/18/2020  Medication Sig   amLODipine (NORVASC) 2.5 MG tablet Take 1 tablet (2.5 mg total) by mouth daily.   diphenhydrAMINE HCl, Sleep, (SLEEP AID, DIPHENHYDRAMINE, PO) Take by mouth.   Docusate Calcium (STOOL SOFTENER PO) Take by mouth.   MELATONIN PO Take by mouth. 5mg    Naproxen  Sodium (ALEVE PO) Take by mouth.   omeprazole (PRILOSEC OTC) 20 MG tablet Take 20 mg by mouth 2 (two) times daily.    polyethylene glycol (MIRALAX / GLYCOLAX) 17 g packet Take 17 g by mouth daily.   umeclidinium bromide (INCRUSE ELLIPTA) 62.5 MCG/INH AEPB Inhale 1 puff into the lungs daily.   No facility-administered encounter medications on file as of 04/18/2020.     Review of Systems  Review of Systems  No orthopnea or PND. R nasal passage feels congested. Comprehensive review of systems otherwise negative.  Physical Exam  BP 110/68 (BP Location: Right Arm, Cuff Size: Normal)    Pulse 99    Temp 98 F (36.7 C) (Oral)    Ht 5' 7.5" (1.715 m)    Wt 195 lb (88.5 kg)    SpO2 95%    BMI 30.09 kg/m   Wt Readings from Last 5 Encounters:  04/18/20 195 lb (88.5 kg)  03/01/20 199 lb (90.3 kg)  08/24/19 193 lb (87.5 kg)  02/12/15 190 lb (86.2 kg)    BMI Readings from Last 5 Encounters:  04/18/20 30.09 kg/m  03/01/20 30.71 kg/m  08/24/19 29.78 kg/m     Physical Exam General: well appearing, in NAD Eyes: EOMI, no icterus Mouth: no lesions, OP is clear Nose: Mild narrowing with edema R nare and associated yellow rhinorrhea, L nare clear Pulmonary: CTAB, no wheezing, good air movement Cardiovascular: RRR, no murmurs, no LE edema Abdomen/GI: BS present, non tender, nondistended Musculoskeletal: No synovitis, no effusions Neuro: Normal gait, CN intact Psych: Normal mood, full affect  Assessment & Plan:   Alicia Wilcox is a 64 year old with PMH of tobacco abuse (130 pack year history, quit 2008) whom we are seeing in consultation at the request of Dr. 2009 for evaluation of dyspnea on exertion.   Given significant smoking history, suspect an element of obstructive lung disease. No PFTs to formally diagnose at this time. Cardiac causes considered but has no chest pain with exertion and denies orthopnea, PND, LE edema. 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 Will obtain spirometry next visit. If spirometry not consistent with COPD will re-evaluate other potential causes of her DOE.  Discussed the rationale and outcomes of lung cancer screening trials. Discussed current USPSTF recommendations for screening. She meets criteria as she is 20, has over 20 pack year history, and quit smoking 13 years ago. A decision aid tool (shouldiscreen.com) was used and her individual characteristics were included and yielded a 6 year risk of death from lung cancer of 2.5%. The risks and benefits of screening were discussed in detail. After shared decision making, at this time she elects NOT to pursue yearly LDCT scan for lung cancer  screening.  Return in about 3 months (around 07/19/2020).   Molli Hazard [facility] 04/18/2020

## 2020-04-19 ENCOUNTER — Encounter: Payer: Self-pay | Admitting: Pulmonary Disease

## 2020-05-31 ENCOUNTER — Institutional Professional Consult (permissible substitution): Payer: Medicare HMO | Admitting: Emergency Medicine

## 2020-08-12 ENCOUNTER — Other Ambulatory Visit: Payer: Self-pay | Admitting: Internal Medicine

## 2021-03-03 ENCOUNTER — Encounter: Payer: Medicare HMO | Admitting: Internal Medicine

## 2021-03-07 ENCOUNTER — Encounter: Payer: Self-pay | Admitting: Internal Medicine

## 2021-03-07 ENCOUNTER — Ambulatory Visit (INDEPENDENT_AMBULATORY_CARE_PROVIDER_SITE_OTHER): Payer: Medicare HMO | Admitting: Internal Medicine

## 2021-03-07 ENCOUNTER — Other Ambulatory Visit: Payer: Self-pay

## 2021-03-07 VITALS — BP 130/78 | HR 83 | Temp 97.2°F | Ht 67.5 in | Wt 188.0 lb

## 2021-03-07 DIAGNOSIS — G479 Sleep disorder, unspecified: Secondary | ICD-10-CM

## 2021-03-07 DIAGNOSIS — K219 Gastro-esophageal reflux disease without esophagitis: Secondary | ICD-10-CM

## 2021-03-07 DIAGNOSIS — J449 Chronic obstructive pulmonary disease, unspecified: Secondary | ICD-10-CM | POA: Diagnosis not present

## 2021-03-07 DIAGNOSIS — Z23 Encounter for immunization: Secondary | ICD-10-CM

## 2021-03-07 DIAGNOSIS — I10 Essential (primary) hypertension: Secondary | ICD-10-CM

## 2021-03-07 DIAGNOSIS — Z7189 Other specified counseling: Secondary | ICD-10-CM

## 2021-03-07 DIAGNOSIS — M159 Polyosteoarthritis, unspecified: Secondary | ICD-10-CM

## 2021-03-07 DIAGNOSIS — Z1211 Encounter for screening for malignant neoplasm of colon: Secondary | ICD-10-CM

## 2021-03-07 DIAGNOSIS — Z0000 Encounter for general adult medical examination without abnormal findings: Secondary | ICD-10-CM

## 2021-03-07 LAB — CBC
HCT: 42 % (ref 36.0–46.0)
Hemoglobin: 14.5 g/dL (ref 12.0–15.0)
MCHC: 34.4 g/dL (ref 30.0–36.0)
MCV: 94.7 fl (ref 78.0–100.0)
Platelets: 197 10*3/uL (ref 150.0–400.0)
RBC: 4.43 Mil/uL (ref 3.87–5.11)
RDW: 12.5 % (ref 11.5–15.5)
WBC: 6.4 10*3/uL (ref 4.0–10.5)

## 2021-03-07 LAB — HEPATIC FUNCTION PANEL
ALT: 29 U/L (ref 0–35)
AST: 36 U/L (ref 0–37)
Albumin: 4.3 g/dL (ref 3.5–5.2)
Alkaline Phosphatase: 112 U/L (ref 39–117)
Bilirubin, Direct: 0.1 mg/dL (ref 0.0–0.3)
Total Bilirubin: 0.7 mg/dL (ref 0.2–1.2)
Total Protein: 7.3 g/dL (ref 6.0–8.3)

## 2021-03-07 LAB — RENAL FUNCTION PANEL
Albumin: 4.3 g/dL (ref 3.5–5.2)
BUN: 8 mg/dL (ref 6–23)
CO2: 27 mEq/L (ref 19–32)
Calcium: 9.6 mg/dL (ref 8.4–10.5)
Chloride: 101 mEq/L (ref 96–112)
Creatinine, Ser: 0.9 mg/dL (ref 0.40–1.20)
GFR: 66.4 mL/min (ref >60.00)
Glucose, Bld: 102 mg/dL — ABNORMAL HIGH (ref 70–99)
Phosphorus: 4 mg/dL (ref 2.3–4.6)
Potassium: 3.9 mEq/L (ref 3.5–5.1)
Sodium: 139 mEq/L (ref 135–145)

## 2021-03-07 LAB — T4, FREE: Free T4: 0.95 ng/dL (ref 0.60–1.60)

## 2021-03-07 NOTE — Assessment & Plan Note (Signed)
Some better with the incruse ellipta Doesn't want lung cancer screening at this point

## 2021-03-07 NOTE — Assessment & Plan Note (Signed)
See social history 

## 2021-03-07 NOTE — Progress Notes (Signed)
Subjective:    Patient ID: Alicia Wilcox, female    DOB: 11-Apr-1956, 65 y.o.   MRN: [id number]  HPI Here for Medicare wellness visit and follow up of chronic health conditions This visit occurred during the SARS-CoV-2 public health emergency.  Safety protocols were in place, including screening questions prior to the visit, additional usage of staff PPE, and extensive cleaning of exam room while observing appropriate contact time as indicated for disinfecting solutions.   Reviewed form and advanced directives Reviewed other doctors Does have about 7 ounces of tequila a night---discussed cutting down No longer uses any tobacco Not much exercise Overdue for vision check---some trouble with left eye (on today's exam) Hearing is okay Doesn't remember any falls (but did check it on sheet). Certainly no injury No depression--but stress with mom with severe illness (who lives with her). No anhedonia Independent with instrumental ADLs--- including the lawn No memory problems---perhaps just not as sharp as in the past  Still limps a bit due to right knee pain Meniscus repair 40 years ago---secondary pain in hip and foot Hasn't seen Dr Luiz Blare for a while Takes 2 aleve bid regularly  Has seen pulmonary  Started on incruse ellipta for presumed COPD This has helped some Does walk a little--not much though  Takes the omeprazole twice a day Rare heartburn depending on what she eats. No dysphagia  No chest pain or SOB No dizziness or syncope No edema---or rarely (like salty food) No palpitations  Current Outpatient Medications on File Prior to Visit  Medication Sig Dispense Refill   amLODipine (NORVASC) 2.5 MG tablet TAKE 1 TABLET BY MOUTH EVERY DAY 90 tablet 2   diphenhydrAMINE HCl, Sleep, (SLEEP AID, DIPHENHYDRAMINE, PO) Take by mouth.     MELATONIN PO Take 10 mg by mouth. 5mg      Naproxen Sodium (ALEVE PO) Take by mouth.     omeprazole (PRILOSEC OTC) 20 MG tablet Take 20 mg by  mouth 2 (two) times daily.      polyethylene glycol (MIRALAX / GLYCOLAX) 17 g packet Take 17 g by mouth daily.     umeclidinium bromide (INCRUSE ELLIPTA) 62.5 MCG/INH AEPB Inhale 1 puff into the lungs daily. 30 each 11   Docusate Calcium (STOOL SOFTENER PO) Take by mouth. (Patient not taking: Reported on 03/07/2021)     No current facility-administered medications on file prior to visit.    Allergies  Allergen Reactions   Vicodin [Hydrocodone-Acetaminophen] Other (See Comments)    vomiting    Past Medical History:  Diagnosis Date   Chronic constipation    Diverticulosis    Generalized osteoarthritis of multiple sites    knees and left shoulder especially   GERD (gastroesophageal reflux disease)    Hypertension     Past Surgical History:  Procedure Laterality Date   CHOLECYSTECTOMY  2009   KNEE ARTHROSCOPY Right 1980's   NEPHRECTOMY     donated kidney    Family History  Problem Relation Age of Onset   COPD Mother    Heart failure Mother    Congestive Heart Failure Mother    Breast cancer Sister    Diabetes Sister    Diabetes Maternal Aunt     Social History   Socioeconomic History   Marital status: Divorced    Spouse name: Not on file   Number of children: 2   Years of education: Not on file   Highest education level: Not on file  Occupational History   Occupation: 2010  Comment: Retired  Tobacco Use   Smoking status: Former    Packs/day: 4.00    Years: 34.00    Pack years: 136.00    Types: Cigarettes    Quit date: 2008    Years since quitting: 14.4   Smokeless tobacco: Never  Vaping Use   Vaping Use: Never used  Substance and Sexual Activity   Alcohol use: Yes    Comment: regular tequila-- 4/night   Drug use: Not on file   Sexual activity: Not on file  Other Topics Concern   Not on file  Social History Narrative   No living will   Sister or mother would be medical decision maker   Would accept resuscitation   Wouldn't want tube feeds if  cognitively unaware   Social Determinants of Health   Financial Resource Strain: Not on file  Food Insecurity: Not on file  Transportation Needs: Not on file  Physical Activity: Not on file  Stress: Not on file  Social Connections: Not on file  Intimate Partner Violence: Not on file   Review of Systems Takes benedryl and melatonin nightly--discussed stopping the benedryl Appetite is fine Weight is down some Wears seat belt Full dentures--they are fine No suspicious skin lesions --just some age spots. Discussed establishing with dermatologist Bowels are fine--no blood     Objective:   Physical Exam Constitutional:      Appearance: Normal appearance.  HENT:     Mouth/Throat:     Comments: No lesions Eyes:     Conjunctiva/sclera: Conjunctivae normal.     Pupils: Pupils are equal, round, and reactive to light.  Cardiovascular:     Rate and Rhythm: Normal rate and regular rhythm.     Pulses: Normal pulses.     Heart sounds: No murmur heard.   No gallop.  Pulmonary:     Effort: Pulmonary effort is normal.     Breath sounds: No wheezing or rales.     Comments: Decreased breath sounds but clear Abdominal:     Palpations: Abdomen is soft.     Tenderness: There is no abdominal tenderness.  Musculoskeletal:     Cervical back: Neck supple.     Right lower leg: No edema.     Left lower leg: No edema.  Lymphadenopathy:     Cervical: No cervical adenopathy.  Skin:    Findings: No rash.     Comments: Seb keratosis on back of right calf  Neurological:     Mental Status: She is alert and oriented to person, place, and time.     Comments: Chase Caller, Obama" [id number] Recall 3/3  Psychiatric:        Mood and Affect: Mood normal.        Behavior: Behavior normal.           Assessment & Plan:

## 2021-03-07 NOTE — Assessment & Plan Note (Signed)
Continues on omeprazole bid

## 2021-03-07 NOTE — Assessment & Plan Note (Signed)
Discussed decreasing tequila and stopping diphenyhydramine

## 2021-03-07 NOTE — Assessment & Plan Note (Signed)
Discussed trying tylenol regularly---and aleve prn only

## 2021-03-07 NOTE — Patient Instructions (Signed)
Please try tylenol arthritis 650mg  three times a day and only use the aleve as needed for more severe pain. Please stop the benedryl----and cut back on the tequila as we discussed. Over time, I expect this to help your sleep (and mental clarity).

## 2021-03-07 NOTE — Addendum Note (Signed)
Addended by: Eual Fines on: 03/07/2021 11:27 AM   Modules accepted: Orders

## 2021-03-07 NOTE — Assessment & Plan Note (Signed)
BP Readings from Last 3 Encounters:  03/07/21 130/78  04/18/20 110/68  03/01/20 128/84   Good control on the amlodipine

## 2021-03-07 NOTE — Assessment & Plan Note (Addendum)
I have personally reviewed the Medicare Annual Wellness questionnaire and have noted 1. The patient's medical and social history 2. Their use of alcohol, tobacco or illicit drugs 3. Their current medications and supplements 4. The patient's functional ability including ADL's, fall risks, home safety risks and hearing or visual             impairment. 5. Diet and physical activities 6. Evidence for depression or mood disorders  The patients weight, height, BMI and visual acuity have been recorded in the chart I have made referrals, counseling and provided education to the patient based review of the above and I have provided the pt with a written personalized care plan for preventive services.  I have provided you with a copy of your personalized plan for preventive services. Please take the time to review along with your updated medication list.  Doing okay overall Discussed more exercise--does have a [employer] at home Pneumovax today Td if any injury Due for second COVID booster soon Flu vaccine in the fall Mammogram every 2 years (due 6/23)

## 2021-03-13 ENCOUNTER — Other Ambulatory Visit (INDEPENDENT_AMBULATORY_CARE_PROVIDER_SITE_OTHER): Payer: Medicare HMO

## 2021-03-13 DIAGNOSIS — Z1211 Encounter for screening for malignant neoplasm of colon: Secondary | ICD-10-CM

## 2021-03-13 LAB — FECAL OCCULT BLOOD, IMMUNOCHEMICAL: Fecal Occult Bld: NEGATIVE

## 2021-04-05 ENCOUNTER — Telehealth: Payer: Self-pay

## 2021-04-05 MED ORDER — AMLODIPINE BESYLATE 2.5 MG PO TABS: 2.5000 mg | ORAL_TABLET | Freq: Every day | ORAL | 3 refills | Status: DC

## 2021-04-05 NOTE — Telephone Encounter (Signed)
Rx sent electronically.  

## 2021-05-10 ENCOUNTER — Other Ambulatory Visit: Payer: Self-pay | Admitting: Internal Medicine

## 2021-08-11 ENCOUNTER — Other Ambulatory Visit: Payer: Self-pay | Admitting: Internal Medicine

## 2022-01-29 IMAGING — MG DIGITAL SCREENING BILAT W/ TOMO W/ CAD
8 series · 8 of 24 positions shown · non-contrast
Comparison: Previous exam(s).

CLINICAL DATA: Screening.

EXAM:
DIGITAL SCREENING BILATERAL MAMMOGRAM WITH TOMO AND CAD

[L CC synth-2D]
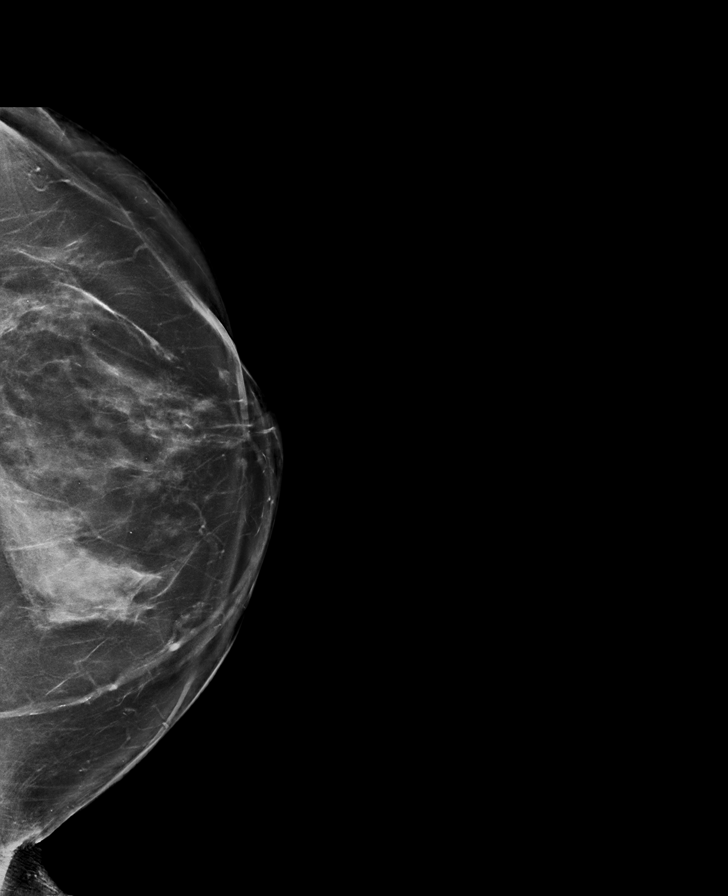

[R CC synth-2D]
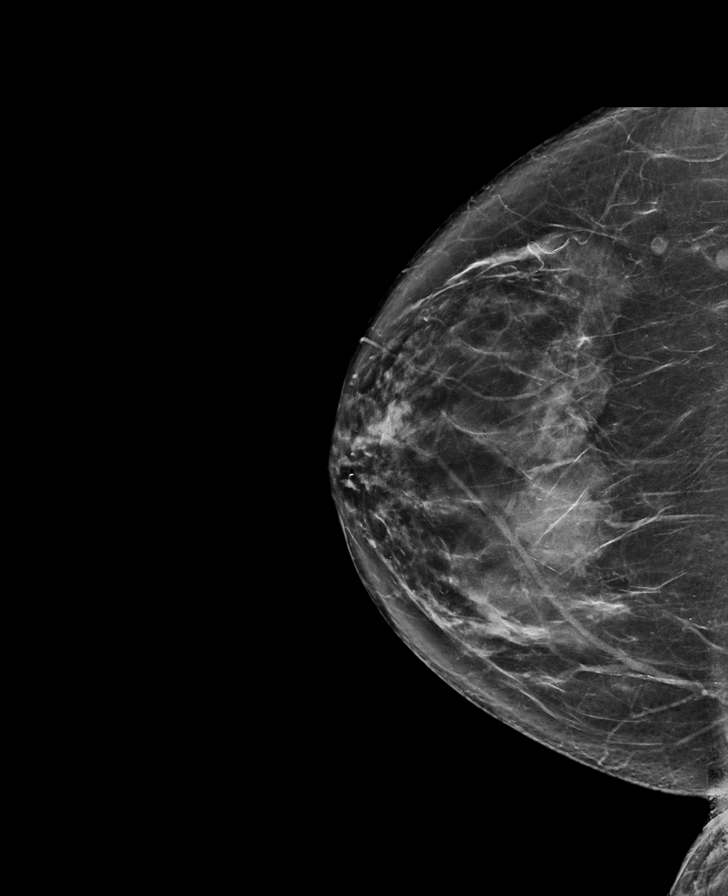

[L MLO synth-2D]
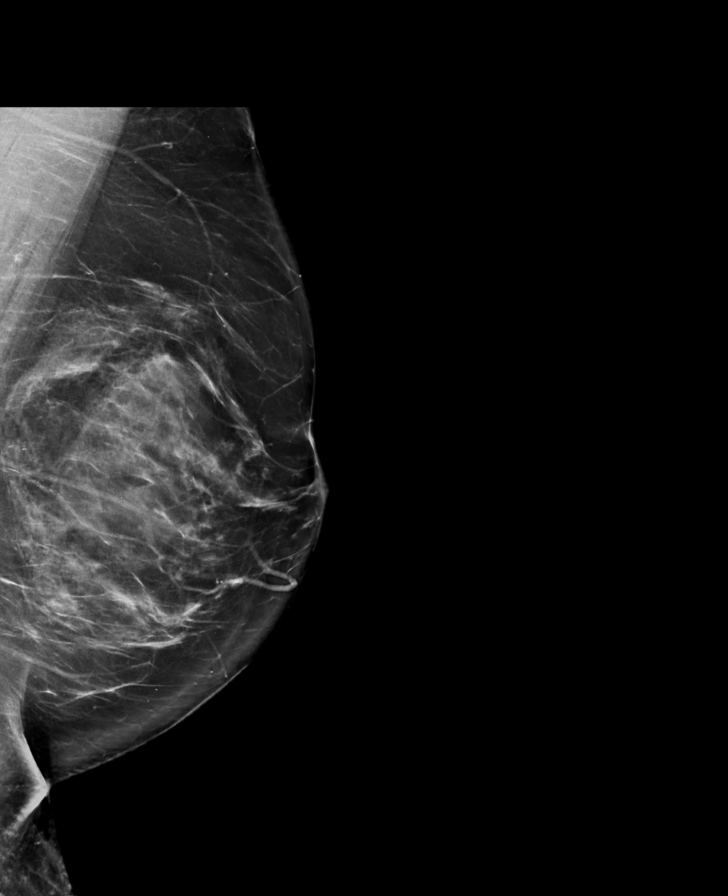

[R MLO synth-2D]
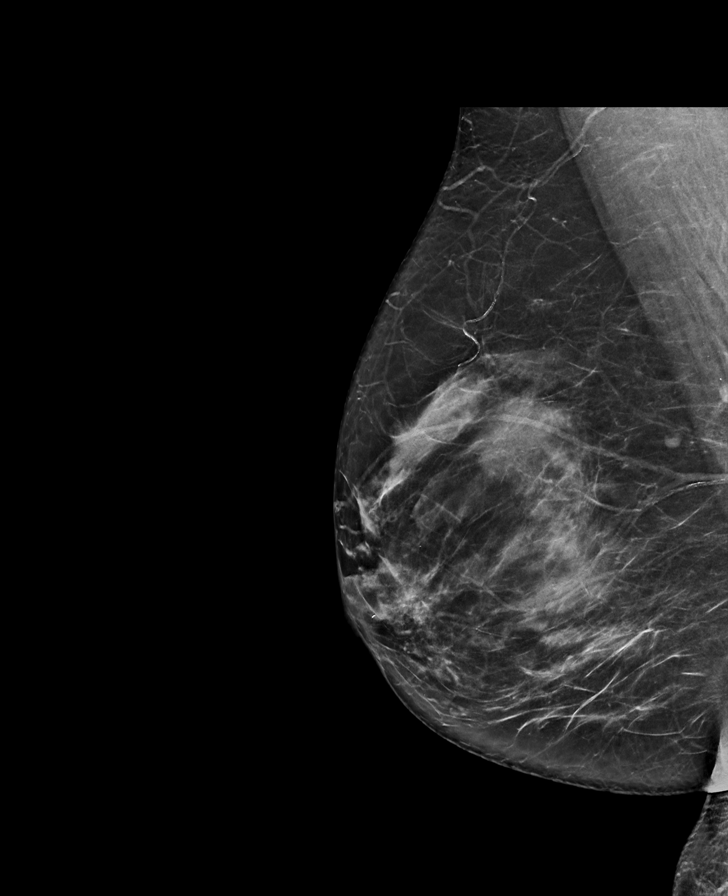

[R CC tomo · tomo slice 38/75.0]
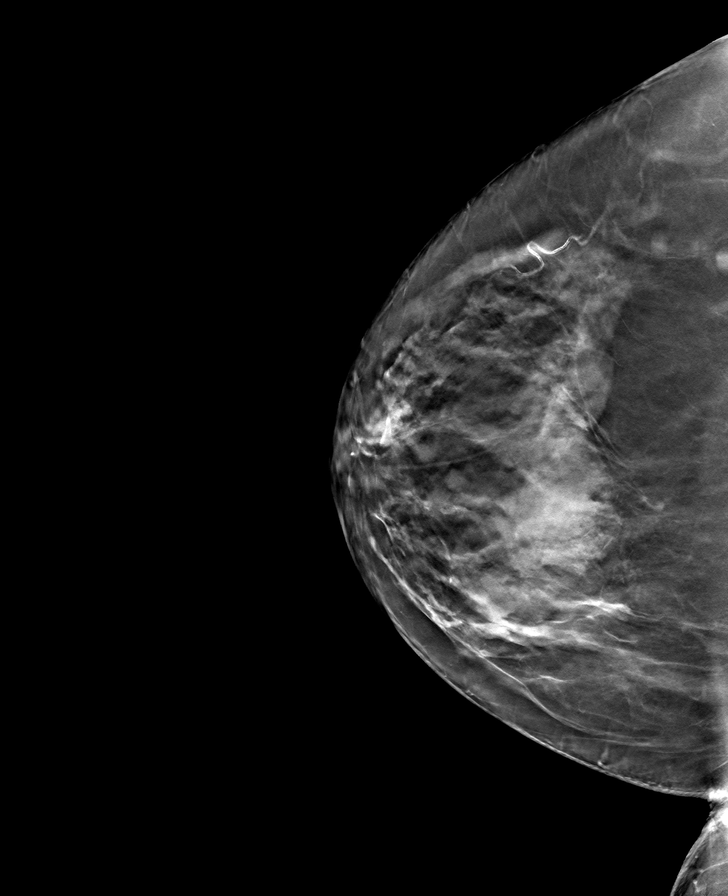

[R MLO tomo · tomo slice 37/74.0]
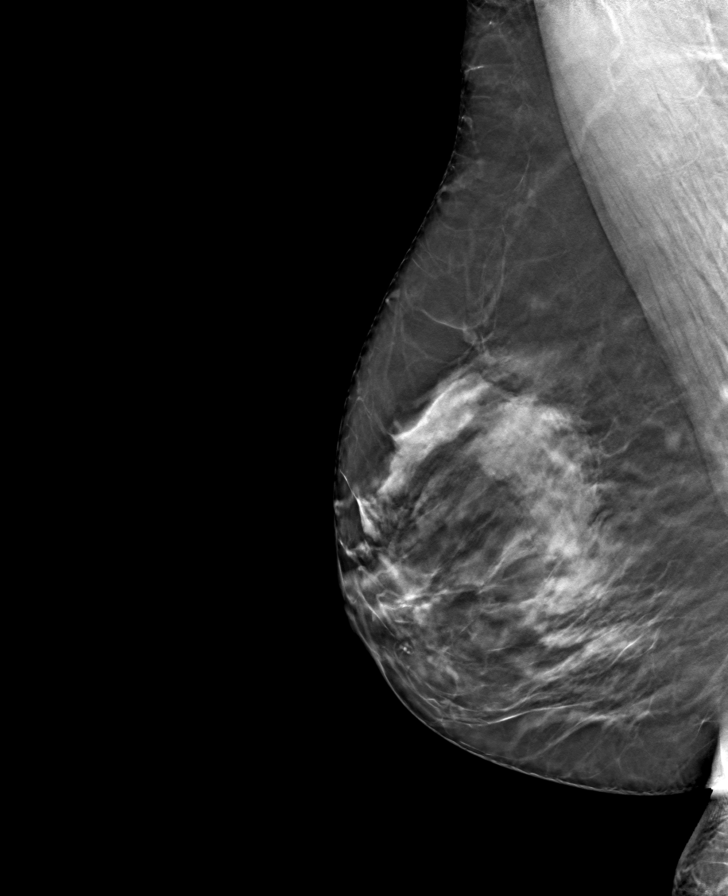

[L MLO tomo · tomo slice 45/90.0]
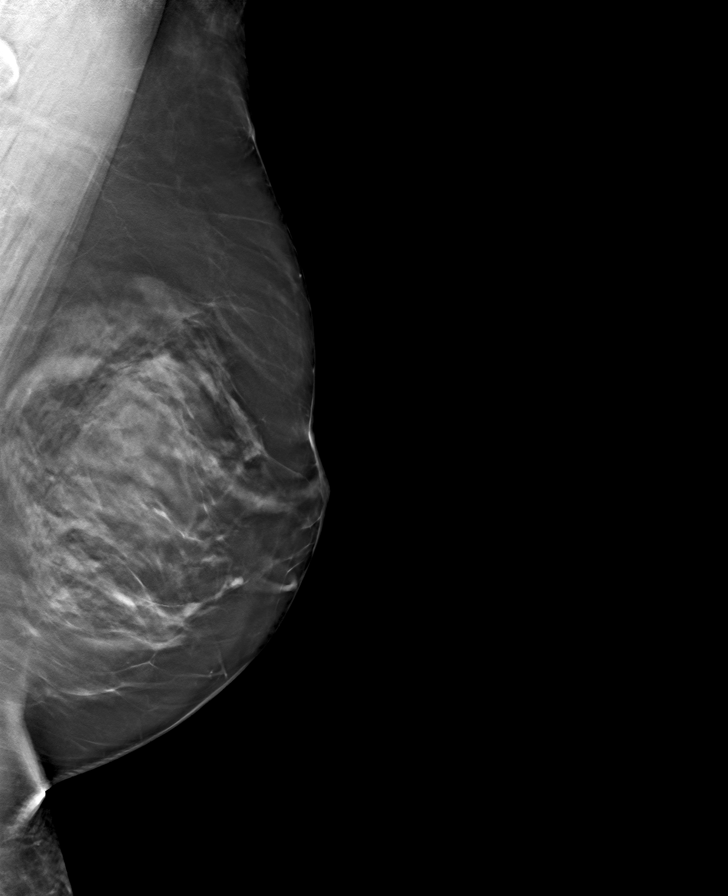

[L CC tomo · tomo slice 45/90.0]
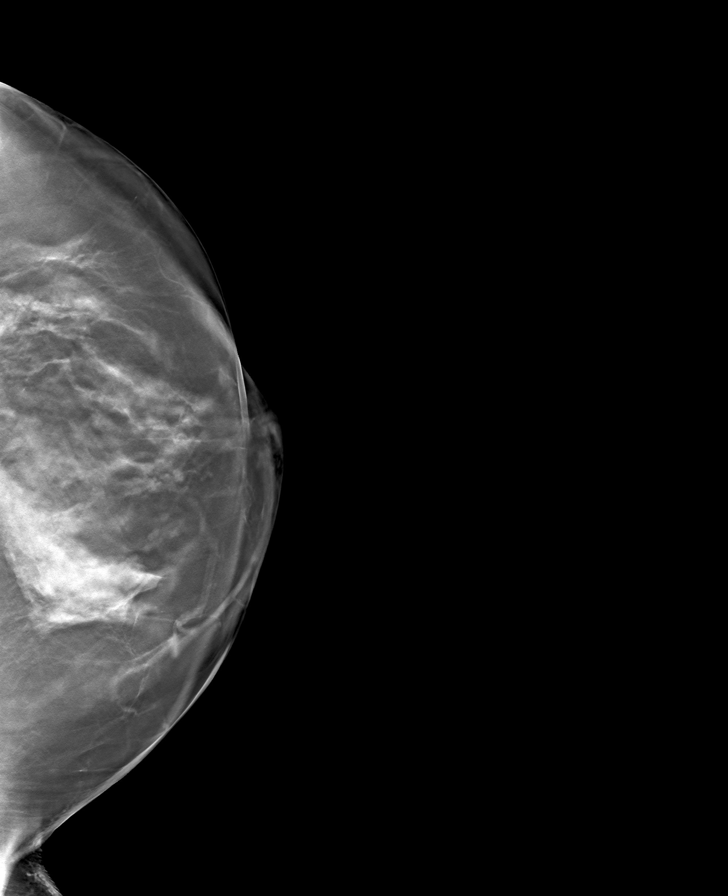

[8 of 24 positions shown; findings below may reference images not displayed]

ACR Breast Density Category c: The breast tissue is heterogeneously
dense, which may obscure small masses.
FINDINGS: There are no findings suspicious for malignancy. Images were
processed with CAD.
IMPRESSION: No mammographic evidence of malignancy. A result letter of this
screening mammogram will be mailed directly to the patient.

RECOMMENDATION:
Screening mammogram in one year. (Code:[id number]-U-LHB)

BI-RADS CATEGORY  1: Negative.

## 2022-03-09 ENCOUNTER — Ambulatory Visit (INDEPENDENT_AMBULATORY_CARE_PROVIDER_SITE_OTHER): Payer: Medicare HMO | Admitting: Internal Medicine

## 2022-03-09 ENCOUNTER — Encounter: Payer: Self-pay | Admitting: Internal Medicine

## 2022-03-09 VITALS — BP 136/84 | HR 86 | Temp 97.4°F | Ht 67.5 in | Wt 180.0 lb

## 2022-03-09 DIAGNOSIS — I10 Essential (primary) hypertension: Secondary | ICD-10-CM

## 2022-03-09 DIAGNOSIS — K219 Gastro-esophageal reflux disease without esophagitis: Secondary | ICD-10-CM

## 2022-03-09 DIAGNOSIS — J449 Chronic obstructive pulmonary disease, unspecified: Secondary | ICD-10-CM | POA: Diagnosis not present

## 2022-03-09 DIAGNOSIS — M159 Polyosteoarthritis, unspecified: Secondary | ICD-10-CM

## 2022-03-09 DIAGNOSIS — G479 Sleep disorder, unspecified: Secondary | ICD-10-CM

## 2022-03-09 DIAGNOSIS — Z1211 Encounter for screening for malignant neoplasm of colon: Secondary | ICD-10-CM

## 2022-03-09 DIAGNOSIS — Z0000 Encounter for general adult medical examination without abnormal findings: Secondary | ICD-10-CM | POA: Diagnosis not present

## 2022-03-09 LAB — COMPREHENSIVE METABOLIC PANEL
ALT: 39 U/L — ABNORMAL HIGH (ref 0–35)
AST: 57 U/L — ABNORMAL HIGH (ref 0–37)
Albumin: 4.2 g/dL (ref 3.5–5.2)
Alkaline Phosphatase: 94 U/L (ref 39–117)
BUN: 9 mg/dL (ref 6–23)
CO2: 28 mEq/L (ref 19–32)
Calcium: 9.4 mg/dL (ref 8.4–10.5)
Chloride: 99 mEq/L (ref 96–112)
Creatinine, Ser: 0.84 mg/dL (ref 0.40–1.20)
GFR: 71.62 mL/min (ref >60.00)
Glucose, Bld: 98 mg/dL (ref 70–99)
Potassium: 3.4 mEq/L — ABNORMAL LOW (ref 3.5–5.1)
Sodium: 140 mEq/L (ref 135–145)
Total Bilirubin: 0.6 mg/dL (ref 0.2–1.2)
Total Protein: 7.3 g/dL (ref 6.0–8.3)

## 2022-03-09 LAB — CBC
HCT: 42.5 % (ref 36.0–46.0)
Hemoglobin: 14.3 g/dL (ref 12.0–15.0)
MCHC: 33.6 g/dL (ref 30.0–36.0)
MCV: 96.2 fl (ref 78.0–100.0)
Platelets: 180 10*3/uL (ref 150.0–400.0)
RBC: 4.42 Mil/uL (ref 3.87–5.11)
RDW: 13.2 % (ref 11.5–15.5)
WBC: 6.1 10*3/uL (ref 4.0–10.5)

## 2022-03-09 LAB — LIPID PANEL
Cholesterol: 254 mg/dL — ABNORMAL HIGH (ref 0–200)
HDL: 63.8 mg/dL (ref >39.00)
NonHDL: 190.61
Total CHOL/HDL Ratio: 4
Triglycerides: 383 mg/dL — ABNORMAL HIGH (ref 0.0–149.0)
VLDL: 76.6 mg/dL — ABNORMAL HIGH (ref 0.0–40.0)

## 2022-03-09 LAB — LDL CHOLESTEROL, DIRECT: Direct LDL: 146 mg/dL

## 2022-03-09 MED ORDER — ALBUTEROL SULFATE HFA 108 (90 BASE) MCG/ACT IN AERS: 2.0000 | INHALATION_SPRAY | Freq: Four times a day (QID) | RESPIRATORY_TRACT | 1 refills | Status: DC | PRN

## 2022-03-09 NOTE — Assessment & Plan Note (Signed)
Didn't notice any difference on incruse ellipta--so stopped Will Rx albuterol for PRN

## 2022-03-09 NOTE — Assessment & Plan Note (Signed)
Back on benedryl---discussed lowest effective dose

## 2022-03-09 NOTE — Progress Notes (Signed)
Vision Screening   Right eye Left eye Both eyes  Without correction     With correction 20/25 20/25 20/20  Hearing Screening - Comments:: Passed whisper test  

## 2022-03-09 NOTE — Patient Instructions (Addendum)
Please set up your screening mammogram. 

## 2022-03-09 NOTE — Assessment & Plan Note (Signed)
Back on the aleve 2 bid On PPI Will recheck renal function

## 2022-03-09 NOTE — Assessment & Plan Note (Signed)
BP Readings from Last 3 Encounters:  03/09/22 136/84  03/07/21 130/78  04/18/20 110/68   Fine on amlodipine 2.5mg 

## 2022-03-09 NOTE — Assessment & Plan Note (Signed)
I have personally reviewed the Medicare Annual Wellness questionnaire and have noted 1. The patient's medical and social history 2. Their use of alcohol, tobacco or illicit drugs 3. Their current medications and supplements 4. The patient's functional ability including ADL's, fall risks, home safety risks and hearing or visual             impairment. 5. Diet and physical activities 6. Evidence for depression or mood disorders  The patients weight, height, BMI and visual acuity have been recorded in the chart I have made referrals, counseling and provided education to the patient based review of the above and I have provided the pt with a written personalized care plan for preventive services.  I have provided you with a copy of your personalized plan for preventive services. Please take the time to review along with your updated medication list.  May get COVID booster in fall (already had bivalent) Flu vaccine in the fall FIT Due for mammogram now Discussed fitness

## 2022-03-09 NOTE — Assessment & Plan Note (Signed)
Quiet on the omeprazole 20mg  bid

## 2022-03-09 NOTE — Progress Notes (Signed)
Subjective:    Patient ID: Alicia Wilcox, female    DOB: 01-26-1956, 66 y.o.   MRN: [id number]  HPI Here for Medicare wellness visit and follow up of chronic health conditions Reviewed advanced directives Reviewed other doctors No hospitalizations or surgery in the past year No tobacco Still enjoys tequila---has cut back Vision has changed some--due for exam (went to [facility]) No falls No depression or anhedonia---just has stress with caring for mom Is walking more now Independent with instrumental ADLs No sig memory issues  Knee is better Lost some weight Found a better brace Able to get out shopping, etc better  Having trouble with left shoulder now--felt something tore about a month ago This is better now Still push mows and does rider, etc Tried tylenol--didn't help Back on the aleve 2 bid  Did try the incruse for a year---didn't notice any difference Not much cough---some hacky allergy cough Breathing is fine---does have some symptoms when walking   No chest pain No palpitations No dizziness or syncope No headaches No edema  Rare heartburn--bananas and cucumbers seem to cause Mostly controlled with med No dysphagia Current Outpatient Medications on File Prior to Visit  Medication Sig Dispense Refill   acetaminophen (TYLENOL) 650 MG CR tablet Take 650 mg by mouth in the morning, at noon, and at bedtime.     amLODipine (NORVASC) 2.5 MG tablet TAKE 1 TABLET BY MOUTH EVERY DAY 90 tablet 3   diphenhydrAMINE (BENADRYL) 50 MG tablet Take 50 mg by mouth at bedtime as needed for itching.     loratadine (CLARITIN) 10 MG tablet Take 10 mg by mouth daily.     MELATONIN PO Take 10 mg by mouth. 5mg      Naproxen Sodium (ALEVE PO) Take 2 tablets by mouth in the morning and at bedtime.     omeprazole (PRILOSEC OTC) 20 MG tablet Take 20 mg by mouth 2 (two) times daily.      polyethylene glycol (MIRALAX / GLYCOLAX) 17 g packet Take 17 g by mouth daily.     No  current facility-administered medications on file prior to visit.    Allergies  Allergen Reactions   Vicodin [Hydrocodone-Acetaminophen] Other (See Comments)    vomiting    Past Medical History:  Diagnosis Date   Chronic constipation    Diverticulosis    Generalized osteoarthritis of multiple sites    knees and left shoulder especially   GERD (gastroesophageal reflux disease)    Hypertension     Past Surgical History:  Procedure Laterality Date   CHOLECYSTECTOMY  2009   KNEE ARTHROSCOPY Right 1980's   NEPHRECTOMY     donated kidney    Family History  Problem Relation Age of Onset   COPD Mother    Heart failure Mother    Congestive Heart Failure Mother    Breast cancer Sister    Diabetes Sister    Diabetes Maternal Aunt     Social History   Socioeconomic History   Marital status: Divorced    Spouse name: Not on file   Number of children: 2   Years of education: Not on file   Highest education level: Not on file  Occupational History   Occupation: Paramedic    Comment: Retired  Tobacco Use   Smoking status: Former    Packs/day: 4.00    Years: 34.00    Total pack years: 136.00    Types: Cigarettes    Quit date: 2008    Years  since quitting: 15.4    Passive exposure: Never (was a smoker for 40 years)   Smokeless tobacco: Never  Vaping Use   Vaping Use: Never used  Substance and Sexual Activity   Alcohol use: Yes    Comment: regular tequila-- 4/night   Drug use: Not on file   Sexual activity: Not on file  Other Topics Concern   Not on file  Social History Narrative   No living will   Sister or mother would be medical decision maker (or children)   Would accept resuscitation---but no prolonged ventilation   Wouldn't want tube feeds if cognitively unaware   Social Determinants of Health   Financial Resource Strain: Not on file  Food Insecurity: Not on file  Transportation Needs: Not on file  Physical Activity: Not on file  Stress: Not on file   Social Connections: Not on file  Intimate Partner Violence: Not on file   Review of Systems Sleeps well if she takes the benedryl 50mg . No hangover effect. Cares for mom (92) who lives with her Appetite is good Weight down 8# since last Wears seat belt Bowels okay---uses miralax prn Voids fine---slight leakage at times--but no pad (stress) No other joint issues Dentures--no dentist Has some rough spots on legs--loofa sponge helps. No suspicious spots    Objective:   Physical Exam Constitutional:      Appearance: Normal appearance.  HENT:     Mouth/Throat:     Comments: No lesions Eyes:     Conjunctiva/sclera: Conjunctivae normal.     Pupils: Pupils are equal, round, and reactive to light.  Cardiovascular:     Rate and Rhythm: Normal rate and regular rhythm.     Pulses: Normal pulses.     Heart sounds: No murmur heard.    No gallop.  Pulmonary:     Effort: Pulmonary effort is normal.     Breath sounds: No wheezing or rales.     Comments: Decreased breath sounds but clear Abdominal:     Palpations: Abdomen is soft.     Tenderness: There is no abdominal tenderness.  Musculoskeletal:     Cervical back: Neck supple.     Right lower leg: No edema.     Left lower leg: No edema.  Lymphadenopathy:     Cervical: No cervical adenopathy.  Skin:    Findings: No lesion or rash.     Comments: Benign keratoses  Neurological:     General: No focal deficit present.     Mental Status: She is alert and oriented to person, place, and time.     Comments: Mini-cog okay---clock good except minute hand. Recall 3/3  Psychiatric:        Mood and Affect: Mood normal.        Behavior: Behavior normal.            Assessment & Plan:

## 2022-03-14 ENCOUNTER — Other Ambulatory Visit (INDEPENDENT_AMBULATORY_CARE_PROVIDER_SITE_OTHER): Payer: Medicare HMO

## 2022-03-14 DIAGNOSIS — Z1211 Encounter for screening for malignant neoplasm of colon: Secondary | ICD-10-CM | POA: Diagnosis not present

## 2022-03-15 LAB — FECAL OCCULT BLOOD, IMMUNOCHEMICAL: Fecal Occult Bld: NEGATIVE

## 2022-08-12 ENCOUNTER — Other Ambulatory Visit: Payer: Self-pay | Admitting: Internal Medicine

## 2022-08-27 ENCOUNTER — Other Ambulatory Visit: Payer: Self-pay | Admitting: Internal Medicine

## 2022-08-27 DIAGNOSIS — Z1231 Encounter for screening mammogram for malignant neoplasm of breast: Secondary | ICD-10-CM

## 2022-10-11 ENCOUNTER — Other Ambulatory Visit: Payer: Self-pay | Admitting: Internal Medicine

## 2022-10-24 ENCOUNTER — Ambulatory Visit
Admission: RE | Admit: 2022-10-24 | Discharge: 2022-10-24 | Disposition: A | Discharge status: Home / Self Care | Payer: Medicare HMO | Source: Ambulatory Visit | Attending: Internal Medicine | Admitting: Internal Medicine

## 2022-10-24 DIAGNOSIS — Z1231 Encounter for screening mammogram for malignant neoplasm of breast: Secondary | ICD-10-CM

## 2022-11-13 ENCOUNTER — Other Ambulatory Visit: Payer: Self-pay | Admitting: Internal Medicine

## 2023-01-07 ENCOUNTER — Other Ambulatory Visit: Payer: Self-pay | Admitting: Internal Medicine

## 2023-02-25 ENCOUNTER — Other Ambulatory Visit: Payer: Self-pay | Admitting: Internal Medicine

## 2023-03-12 ENCOUNTER — Encounter: Payer: Self-pay | Admitting: Internal Medicine

## 2023-03-12 ENCOUNTER — Ambulatory Visit (INDEPENDENT_AMBULATORY_CARE_PROVIDER_SITE_OTHER): Payer: Medicare HMO | Admitting: Internal Medicine

## 2023-03-12 VITALS — BP 138/86 | HR 97 | Temp 97.9°F | Ht 66.5 in | Wt 171.0 lb

## 2023-03-12 DIAGNOSIS — I10 Essential (primary) hypertension: Secondary | ICD-10-CM | POA: Diagnosis not present

## 2023-03-12 DIAGNOSIS — E785 Hyperlipidemia, unspecified: Secondary | ICD-10-CM

## 2023-03-12 DIAGNOSIS — G479 Sleep disorder, unspecified: Secondary | ICD-10-CM

## 2023-03-12 DIAGNOSIS — J449 Chronic obstructive pulmonary disease, unspecified: Secondary | ICD-10-CM

## 2023-03-12 DIAGNOSIS — Z0000 Encounter for general adult medical examination without abnormal findings: Secondary | ICD-10-CM | POA: Diagnosis not present

## 2023-03-12 DIAGNOSIS — K219 Gastro-esophageal reflux disease without esophagitis: Secondary | ICD-10-CM | POA: Diagnosis not present

## 2023-03-12 DIAGNOSIS — M159 Polyosteoarthritis, unspecified: Secondary | ICD-10-CM

## 2023-03-12 DIAGNOSIS — Z1211 Encounter for screening for malignant neoplasm of colon: Secondary | ICD-10-CM

## 2023-03-12 LAB — RENAL FUNCTION PANEL
Albumin: 4.3 g/dL (ref 3.5–5.2)
BUN: 8 mg/dL (ref 6–23)
CO2: 29 mEq/L (ref 19–32)
Calcium: 9.5 mg/dL (ref 8.4–10.5)
Chloride: 102 mEq/L (ref 96–112)
Creatinine, Ser: 0.82 mg/dL (ref 0.40–1.20)
GFR: 73.21 mL/min (ref >60.00)
Glucose, Bld: 103 mg/dL — ABNORMAL HIGH (ref 70–99)
Phosphorus: 3.7 mg/dL (ref 2.3–4.6)
Potassium: 3.7 mEq/L (ref 3.5–5.1)
Sodium: 140 mEq/L (ref 135–145)

## 2023-03-12 LAB — LIPID PANEL
Cholesterol: 235 mg/dL — ABNORMAL HIGH (ref 0–200)
HDL: 61.4 mg/dL (ref >39.00)
NonHDL: 173.81
Total CHOL/HDL Ratio: 4
Triglycerides: 338 mg/dL — ABNORMAL HIGH (ref 0.0–149.0)
VLDL: 67.6 mg/dL — ABNORMAL HIGH (ref 0.0–40.0)

## 2023-03-12 LAB — CBC
HCT: 43.6 % (ref 36.0–46.0)
Hemoglobin: 14.4 g/dL (ref 12.0–15.0)
MCHC: 32.9 g/dL (ref 30.0–36.0)
MCV: 96.5 fl (ref 78.0–100.0)
Platelets: 225 10*3/uL (ref 150.0–400.0)
RBC: 4.52 Mil/uL (ref 3.87–5.11)
RDW: 12.6 % (ref 11.5–15.5)
WBC: 5.9 10*3/uL (ref 4.0–10.5)

## 2023-03-12 LAB — HEPATIC FUNCTION PANEL
ALT: 26 U/L (ref 0–35)
AST: 35 U/L (ref 0–37)
Albumin: 4.3 g/dL (ref 3.5–5.2)
Alkaline Phosphatase: 123 U/L — ABNORMAL HIGH (ref 39–117)
Bilirubin, Direct: 0.1 mg/dL (ref 0.0–0.3)
Total Bilirubin: 0.6 mg/dL (ref 0.2–1.2)
Total Protein: 7.8 g/dL (ref 6.0–8.3)

## 2023-03-12 LAB — LDL CHOLESTEROL, DIRECT: Direct LDL: 134 mg/dL

## 2023-03-12 MED ORDER — PANTOPRAZOLE SODIUM 40 MG PO TBEC: 40.0000 mg | DELAYED_RELEASE_TABLET | Freq: Two times a day (BID) | ORAL | 3 refills | Status: DC

## 2023-03-12 NOTE — Assessment & Plan Note (Signed)
She prefers no statin for primary prevention

## 2023-03-12 NOTE — Assessment & Plan Note (Signed)
Didn't tolerate off naproxen Will recheck renal function

## 2023-03-12 NOTE — Assessment & Plan Note (Signed)
Urged her to cut back on the benedryl Melatonin 10 nightly

## 2023-03-12 NOTE — Progress Notes (Signed)
Subjective:    Patient ID: Alicia Wilcox, female    DOB: [birth year], 67 y.o.   MRN: [id number]  HPI Here for Medicare wellness visit and follow up of chronic health conditions Reviewed advanced directives Reviewed other doctors--[facility] No hospitalizations or surgery in the past year Vision is okay--due for exam Hearing is fine No tobacco now Enjoys tequila in evening Does walk regularly--discussed resistance training (has [employer] gym) No falls No depression or anhedonia---just has stress Independent with instrumental ADLs No worrisome memory issues  Busy taking care of mother--who lives with her Multiple issues--hospital and rehab, etc  Still has heartburn despite the omeprazole 20 bid--does take on empty stomach Has to take prn meds Wants to try something else No dysphagia  No Rx for COPD except for albuterol Does use this before walking the dog No regular cough--just once in a while  No chest pain or SOB No dizziness or syncope No palpitations No sig edema No headaches  Uses naproxen daily 440 bid for arthritis-didn't do well switching Occasionally tylenol as well  Current Outpatient Medications on File Prior to Visit  Medication Sig Dispense Refill   acetaminophen (TYLENOL) 650 MG CR tablet Take 650 mg by mouth in the morning, at noon, and at bedtime.     albuterol (VENTOLIN HFA) 108 (90 Base) MCG/ACT inhaler TAKE 2 PUFFS BY MOUTH EVERY 6 HOURS AS NEEDED FOR WHEEZE OR SHORTNESS OF BREATH 18 each 1   amLODipine (NORVASC) 2.5 MG tablet TAKE 1 TABLET BY MOUTH EVERY DAY 90 tablet 1   diphenhydrAMINE (BENADRYL) 50 MG tablet Take 50 mg by mouth at bedtime as needed for itching.     loratadine (CLARITIN) 10 MG tablet Take 10 mg by mouth daily.     MELATONIN PO Take 10 mg by mouth. 5mg      Naproxen Sodium (ALEVE PO) Take 2 tablets by mouth in the morning and at bedtime.     omeprazole (PRILOSEC OTC) 20 MG tablet Take 20 mg by mouth 2 (two) times daily.       polyethylene glycol (MIRALAX / GLYCOLAX) 17 g packet Take 17 g by mouth daily as needed.     No current facility-administered medications on file prior to visit.    Allergies  Allergen Reactions   Vicodin [Hydrocodone-Acetaminophen] Other (See Comments)    vomiting    Past Medical History:  Diagnosis Date   Chronic constipation    Diverticulosis    Generalized osteoarthritis of multiple sites    knees and left shoulder especially   GERD (gastroesophageal reflux disease)    Hypertension     Past Surgical History:  Procedure Laterality Date   CHOLECYSTECTOMY  2009   KNEE ARTHROSCOPY Right 1980's   NEPHRECTOMY     donated kidney    Family History  Problem Relation Age of Onset   COPD Mother    Heart failure Mother    Congestive Heart Failure Mother    Breast cancer Sister    Diabetes Sister    Diabetes Maternal Aunt     Social History   Socioeconomic History   Marital status: Divorced    Spouse name: Not on file   Number of children: 2   Years of education: Not on file   Highest education level: Not on file  Occupational History   Occupation: Paramedic    Comment: Retired  Tobacco Use   Smoking status: Former    Packs/day: 4.00    Years: 34.00    Additional  pack years: 0.00    Total pack years: 136.00    Types: Cigarettes    Quit date: 2008    Years since quitting: 16.4    Passive exposure: Never (was a smoker for 40 years)   Smokeless tobacco: Never  Vaping Use   Vaping Use: Never used  Substance and Sexual Activity   Alcohol use: Yes    Comment: regular tequila-- 4/night   Drug use: Not on file   Sexual activity: Not on file  Other Topics Concern   Not on file  Social History Narrative   No living will   Children would be medical decision makers--alternate is sister   Would accept resuscitation---but no prolonged ventilation   Wouldn't want tube feeds if cognitively unaware   Social Determinants of Health   Financial Resource Strain: Not on  file  Food Insecurity: Not on file  Transportation Needs: Not on file  Physical Activity: Not on file  Stress: Not on file  Social Connections: Not on file  Intimate Partner Violence: Not on file   Review of Systems Appetite is fine---working on eating less Weight down another 10# or so Doesn't sleep well---3 dogs, worries about mom, etc. Uses melatonin and benedryl (discussed this) Wears seat belt Full dentures--no dentist Has some little spots on legs to be checked. Recommended regular derm evaluation Bowels move fine--no blood No urinary problems     Objective:   Physical Exam Constitutional:      Appearance: Normal appearance.  HENT:     Mouth/Throat:     Pharynx: No oropharyngeal exudate or posterior oropharyngeal erythema.  Eyes:     Conjunctiva/sclera: Conjunctivae normal.     Pupils: Pupils are equal, round, and reactive to light.  Cardiovascular:     Rate and Rhythm: Normal rate and regular rhythm.     Pulses: Normal pulses.     Heart sounds: No murmur heard.    No gallop.  Pulmonary:     Effort: Pulmonary effort is normal.     Breath sounds: No wheezing or rales.     Comments: Reduced breath sounds but clear Abdominal:     Palpations: Abdomen is soft.     Tenderness: There is no abdominal tenderness.  Musculoskeletal:     Cervical back: Neck supple.     Right lower leg: No edema.     Left lower leg: No edema.  Lymphadenopathy:     Cervical: No cervical adenopathy.  Skin:    Findings: No rash.  Neurological:     General: No focal deficit present.     Mental Status: She is alert and oriented to person, place, and time.     Comments: Word naming 12/30 seconds Recall 3/3  Psychiatric:        Mood and Affect: Mood normal.        Behavior: Behavior normal.            Assessment & Plan:

## 2023-03-12 NOTE — Assessment & Plan Note (Signed)
I have personally reviewed the Medicare Annual Wellness questionnaire and have noted 1. The patient's medical and social history 2. Their use of alcohol, tobacco or illicit drugs 3. Their current medications and supplements 4. The patient's functional ability including ADL's, fall risks, home safety risks and hearing or visual             impairment. 5. Diet and physical activities 6. Evidence for depression or mood disorders  The patients weight, height, BMI and visual acuity have been recorded in the chart I have made referrals, counseling and provided education to the patient based review of the above and I have provided the pt with a written personalized care plan for preventive services.  I have provided you with a copy of your personalized plan for preventive services. Please take the time to review along with your updated medication list.  Still prefers FIT  Mammogram every 1-2 years till 73--had in January No pap due to age Keeps up with COVID vaccines Flu/RSV this fall Due to Td--at pharmacy

## 2023-03-12 NOTE — Assessment & Plan Note (Signed)
BP Readings from Last 3 Encounters:  03/12/23 138/86  03/09/22 136/84  03/07/21 130/78   Controlled on amlodipine 2.5mg  daily

## 2023-03-12 NOTE — Assessment & Plan Note (Signed)
Mild symptoms only Uses albuterol before walking No lung cancer screening--quit over 15 years ago

## 2023-03-12 NOTE — Progress Notes (Signed)
Vision Screening   Right eye Left eye Both eyes  Without correction     With correction 20/20 20/20 20/20  Hearing Screening - Comments:: Passed whisper test  

## 2023-03-12 NOTE — Assessment & Plan Note (Signed)
Breakthrough with omeprazole Will try changing to pantoprazole 40 bid

## 2023-03-18 ENCOUNTER — Other Ambulatory Visit: Payer: Self-pay | Admitting: Radiology

## 2023-03-18 DIAGNOSIS — Z1211 Encounter for screening for malignant neoplasm of colon: Secondary | ICD-10-CM

## 2023-03-20 LAB — FECAL OCCULT BLOOD, IMMUNOCHEMICAL: Fecal Occult Bld: NEGATIVE

## 2023-04-17 ENCOUNTER — Other Ambulatory Visit: Payer: Self-pay | Admitting: Internal Medicine

## 2024-02-18 ENCOUNTER — Ambulatory Visit

## 2024-03-13 ENCOUNTER — Other Ambulatory Visit: Payer: Self-pay | Admitting: Internal Medicine

## 2024-03-19 ENCOUNTER — Ambulatory Visit: Payer: Self-pay | Admitting: Internal Medicine

## 2024-03-19 ENCOUNTER — Encounter: Payer: Self-pay | Admitting: Internal Medicine

## 2024-03-19 ENCOUNTER — Ambulatory Visit: Payer: Medicare HMO | Admitting: Internal Medicine

## 2024-03-19 VITALS — BP 138/88 | HR 95 | Temp 98.4°F | Ht 67.5 in | Wt 157.0 lb

## 2024-03-19 DIAGNOSIS — M159 Polyosteoarthritis, unspecified: Secondary | ICD-10-CM

## 2024-03-19 DIAGNOSIS — G479 Sleep disorder, unspecified: Secondary | ICD-10-CM

## 2024-03-19 DIAGNOSIS — E785 Hyperlipidemia, unspecified: Secondary | ICD-10-CM | POA: Diagnosis not present

## 2024-03-19 DIAGNOSIS — Z0000 Encounter for general adult medical examination without abnormal findings: Secondary | ICD-10-CM

## 2024-03-19 DIAGNOSIS — J449 Chronic obstructive pulmonary disease, unspecified: Secondary | ICD-10-CM

## 2024-03-19 DIAGNOSIS — Z1211 Encounter for screening for malignant neoplasm of colon: Secondary | ICD-10-CM

## 2024-03-19 DIAGNOSIS — I10 Essential (primary) hypertension: Secondary | ICD-10-CM

## 2024-03-19 LAB — LIPID PANEL
Cholesterol: 235 mg/dL — ABNORMAL HIGH (ref 0–200)
HDL: 74.8 mg/dL (ref >39.00)
LDL Cholesterol: 92 mg/dL (ref 0–99)
NonHDL: 160.62
Total CHOL/HDL Ratio: 3
Triglycerides: 344 mg/dL — ABNORMAL HIGH (ref 0.0–149.0)
VLDL: 68.8 mg/dL — ABNORMAL HIGH (ref 0.0–40.0)

## 2024-03-19 LAB — RENAL FUNCTION PANEL
Albumin: 4.2 g/dL (ref 3.5–5.2)
BUN: 9 mg/dL (ref 6–23)
CO2: 29 meq/L (ref 19–32)
Calcium: 9.3 mg/dL (ref 8.4–10.5)
Chloride: 103 meq/L (ref 96–112)
Creatinine, Ser: 0.76 mg/dL (ref 0.40–1.20)
GFR: 79.62 mL/min (ref >60.00)
Glucose, Bld: 98 mg/dL (ref 70–99)
Phosphorus: 4 mg/dL (ref 2.3–4.6)
Potassium: 3.5 meq/L (ref 3.5–5.1)
Sodium: 141 meq/L (ref 135–145)

## 2024-03-19 LAB — CBC
HCT: 43.8 % (ref 36.0–46.0)
Hemoglobin: 14.8 g/dL (ref 12.0–15.0)
MCHC: 33.9 g/dL (ref 30.0–36.0)
MCV: 100.5 fl — ABNORMAL HIGH (ref 78.0–100.0)
Platelets: 216 10*3/uL (ref 150.0–400.0)
RBC: 4.36 Mil/uL (ref 3.87–5.11)
RDW: 13.5 % (ref 11.5–15.5)
WBC: 4.9 10*3/uL (ref 4.0–10.5)

## 2024-03-19 LAB — HEPATIC FUNCTION PANEL
ALT: 22 U/L (ref 0–35)
AST: 39 U/L — ABNORMAL HIGH (ref 0–37)
Albumin: 4.2 g/dL (ref 3.5–5.2)
Alkaline Phosphatase: 144 U/L — ABNORMAL HIGH (ref 39–117)
Bilirubin, Direct: 0.2 mg/dL (ref 0.0–0.3)
Total Bilirubin: 0.8 mg/dL (ref 0.2–1.2)
Total Protein: 7.2 g/dL (ref 6.0–8.3)

## 2024-03-19 NOTE — Assessment & Plan Note (Signed)
 Mild symptoms Occ albuterol  No tobacco in about 16 years

## 2024-03-19 NOTE — Assessment & Plan Note (Signed)
 Will recheck with weight loss

## 2024-03-19 NOTE — Assessment & Plan Note (Signed)
 BP Readings from Last 3 Encounters:  03/19/24 138/88  03/12/23 138/86  03/09/22 136/84   Amlodipine  2.5mg  daily

## 2024-03-19 NOTE — Assessment & Plan Note (Signed)
 I have personally reviewed the Medicare Annual Wellness questionnaire and have noted 1. The patient's medical and social history 2. Their use of alcohol, tobacco or illicit drugs 3. Their current medications and supplements 4. The patient's functional ability including ADL's, fall risks, home safety risks and hearing or visual             impairment. 5. Diet and physical activities 6. Evidence for depression or mood disorders  The patients weight, height, BMI and visual acuity have been recorded in the chart I have made referrals, counseling and provided education to the patient based review of the above and I have provided the pt with a written personalized care plan for preventive services.  I have provided you with a copy of your personalized plan for preventive services. Please take the time to review along with your updated medication list.  FIT again Mammogram every 2 years--due by January No pap due to age Discussed resistance work Flu/COVID vaccine in the fall

## 2024-03-19 NOTE — Assessment & Plan Note (Signed)
 Urged her to decrease the benedryl
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
## 2024-03-19 NOTE — Assessment & Plan Note (Signed)
 Uses aleve bid Will check renal

## 2024-03-19 NOTE — Progress Notes (Signed)
Vision Screening   Right eye Left eye Both eyes  Without correction     With correction 20/25 20/25 20/20  Hearing Screening - Comments:: Passed whisper test  

## 2024-03-19 NOTE — Patient Instructions (Signed)
 Let us  know if you don't change your care to someone closer to home.

## 2024-03-24 ENCOUNTER — Other Ambulatory Visit (INDEPENDENT_AMBULATORY_CARE_PROVIDER_SITE_OTHER): Payer: Self-pay

## 2024-03-24 DIAGNOSIS — Z1211 Encounter for screening for malignant neoplasm of colon: Secondary | ICD-10-CM

## 2024-03-26 ENCOUNTER — Other Ambulatory Visit: Payer: Self-pay | Admitting: Internal Medicine

## 2024-03-26 LAB — FECAL OCCULT BLOOD, IMMUNOCHEMICAL: Fecal Occult Bld: NEGATIVE

## 2024-05-17 ENCOUNTER — Other Ambulatory Visit: Payer: Self-pay | Admitting: Internal Medicine

## 2024-05-17 ENCOUNTER — Encounter: Payer: Self-pay | Admitting: Internal Medicine

## 2024-05-18 MED ORDER — ALBUTEROL SULFATE HFA 108 (90 BASE) MCG/ACT IN AERS: 2.0000 | INHALATION_SPRAY | Freq: Four times a day (QID) | RESPIRATORY_TRACT | 1 refills | Status: DC | PRN

## 2024-07-13 ENCOUNTER — Other Ambulatory Visit: Payer: Self-pay

## 2024-07-13 MED ORDER — ALBUTEROL SULFATE HFA 108 (90 BASE) MCG/ACT IN AERS: 2.0000 | INHALATION_SPRAY | Freq: Four times a day (QID) | RESPIRATORY_TRACT | 0 refills | Status: AC | PRN

## 2024-07-13 NOTE — Telephone Encounter (Signed)
 Pt stated that she is looking for something closer to her home its [place] a half drive

## 2024-07-13 NOTE — Telephone Encounter (Signed)
 Didn't mean to send to [facility]

## 2024-07-13 NOTE — Telephone Encounter (Signed)
 Pt needs a TOC appointment with whomever she planned to transfer to. Thank you.

## 2024-08-13 ENCOUNTER — Other Ambulatory Visit: Payer: Self-pay

## 2024-08-13 ENCOUNTER — Ambulatory Visit
Admission: EM | Admit: 2024-08-13 | Discharge: 2024-08-13 | Disposition: A | Discharge status: Home / Self Care | Attending: Emergency Medicine | Admitting: Emergency Medicine

## 2024-08-13 DIAGNOSIS — L03012 Cellulitis of left finger: Secondary | ICD-10-CM | POA: Diagnosis not present

## 2024-08-13 DIAGNOSIS — W540XXA Bitten by dog, initial encounter: Secondary | ICD-10-CM

## 2024-08-13 MED ORDER — AMOXICILLIN-POT CLAVULANATE 875-125 MG PO TABS: 1.0000 | ORAL_TABLET | Freq: Two times a day (BID) | ORAL | 0 refills | Status: DC

## 2024-08-13 MED ORDER — AMOXICILLIN-POT CLAVULANATE 875-125 MG PO TABS: 1.0000 | ORAL_TABLET | Freq: Two times a day (BID) | ORAL | 0 refills | Status: AC

## 2024-08-13 NOTE — ED Provider Notes (Signed)
 [facility]    CSN: [id number] Arrival date & time: 08/13/24  0940      History   Chief Complaint Chief Complaint  Patient presents with   Animal Bite    HPI Alicia Wilcox is a 68 y.o. female.   Dog bite to left pinky finger occurred 2 weeks ago The dog is hers. Up to date on vaccines including rabies.   She has developed swelling and warmth of the finger. Having clear and yellow drainage.  She denies pain. Finger feels hard to move.   Tried using mupirocin and neosporin  Also tried poking with needle  Denies fever or chills. No vomiting/diarrhea  Denies redness spreading to hand or arm.  Last tdap was 02/2023   Past Medical History:  Diagnosis Date   Chronic constipation    Diverticulosis    Generalized osteoarthritis of multiple sites    knees and left shoulder especially   GERD (gastroesophageal reflux disease)    Hypertension     Patient Active Problem List   Diagnosis Date Noted   Hyperlipidemia, unspecified 03/12/2023   COPD (chronic obstructive pulmonary disease) (HCC) 03/01/2020   Advance directive discussed with patient 03/01/2020   Sleep disturbance 08/24/2019   Preventative health care 08/24/2019   Hypertension    GERD (gastroesophageal reflux disease)    Generalized osteoarthritis of multiple sites     Past Surgical History:  Procedure Laterality Date   CHOLECYSTECTOMY  2009   KNEE ARTHROSCOPY Right 1980's   NEPHRECTOMY     donated kidney    OB History   No obstetric history on file.      Home Medications    Prior to Admission medications   Medication Sig Start Date End Date Taking? Authorizing Provider  acetaminophen (TYLENOL) 650 MG CR tablet Take 650 mg by mouth every 8 (eight) hours as needed.    [provider]  albuterol  (VENTOLIN  HFA) 108 (90 Base) MCG/ACT inhaler Inhale 2 puffs into the lungs every 6 (six) hours as needed for wheezing or shortness of breath. 07/13/24   Webb, Padonda B, FNP   amLODipine  (NORVASC ) 2.5 MG tablet TAKE 1 TABLET BY MOUTH EVERY DAY 03/26/24   Letvak, Richard I, MD  amoxicillin-clavulanate (AUGMENTIN) 875-125 MG tablet Take 1 tablet by mouth every 12 (twelve) hours for 10 days. 08/13/24 08/23/24  Lynett Brasil, As[place], PA-C  diphenhydrAMINE (BENADRYL) 50 MG tablet Take 50 mg by mouth at bedtime as needed for itching.    [provider]  loratadine (CLARITIN) 10 MG tablet Take 10 mg by mouth daily.    [provider]  MELATONIN PO Take 10 mg by mouth. 5mg     [provider]  Naproxen Sodium (ALEVE PO) Take 2 tablets by mouth in the morning and at bedtime.    [provider]  pantoprazole  (PROTONIX ) 40 MG tablet TAKE 1 TABLET (40 MG TOTAL) BY MOUTH TWICE A DAY BEFORE MEALS 03/13/24   Jimmy Ade I, MD  polyethylene glycol (MIRALAX / GLYCOLAX) 17 g packet Take 17 g by mouth daily as needed.    [provider]    Family History Family History  Problem Relation Age of Onset   COPD Mother    Heart failure Mother    Congestive Heart Failure Mother    Breast cancer Sister    Diabetes Sister    Diabetes Maternal Aunt     Social History Social History   Tobacco Use   Smoking status: Former    Current  packs/day: 0.00    Average packs/day: 4.0 packs/day for 34.0 years (136.0 ttl pk-yrs)    Types: Cigarettes    Start date: 52    Quit date: 2008    Years since quitting: 17.8    Passive exposure: Never (was a smoker for 40 years)   Smokeless tobacco: Never  Vaping Use   Vaping status: Never Used  Substance Use Topics   Alcohol use: Yes    Comment: regular tequila-- 4/night   Drug use: Never     Allergies   Vicodin [hydrocodone-acetaminophen]   Review of Systems Review of Systems  As per HPI  Physical Exam Triage Vital Signs ED Triage Vitals  Encounter Vitals Group     BP 08/13/24 0954 (!) 164/89     Girls Systolic BP Percentile --      Girls Diastolic BP Percentile --      Boys Systolic BP  Percentile --      Boys Diastolic BP Percentile --      Pulse Rate 08/13/24 0954 100     Resp 08/13/24 0954 16     Temp 08/13/24 0954 98.5 F (36.9 C)     Temp Source 08/13/24 0954 Oral     SpO2 08/13/24 0954 95 %     Weight 08/13/24 1001 142 lb (64.4 kg)     Height 08/13/24 1001 5' 9 (1.753 m)     Head Circumference --      Peak Flow --      Pain Score 08/13/24 1000 0     Pain Loc --      Pain Education --      Exclude from Growth Chart --    No data found.  Updated Vital Signs BP (!) 142/88 (BP Location: Left Arm)   Pulse 100   Temp 98.5 F (36.9 C) (Oral)   Resp 16   Ht 5' 9 (1.753 m)   Wt 142 lb (64.4 kg)   SpO2 95%   BMI 20.97 kg/m   Physical Exam Vitals and nursing note reviewed.  Constitutional:      General: She is not in acute distress.    Appearance: Normal appearance.  HENT:     Mouth/Throat:     Mouth: Mucous membranes are moist.     Pharynx: Oropharynx is clear.  Eyes:     Conjunctiva/sclera: Conjunctivae normal.  Cardiovascular:     Rate and Rhythm: Normal rate and regular rhythm.     Pulses: Normal pulses.     Heart sounds: Normal heart sounds.  Pulmonary:     Effort: Pulmonary effort is normal.     Breath sounds: Normal breath sounds.  Musculoskeletal:        General: Normal range of motion.       Hands:     Cervical back: Normal range of motion.  Skin:    Findings: Erythema and wound present.     Comments: Left hand pinky finger base has two puncture wounds. One has clear drainage, the other has yellow drainage. Erythema and 1+ swelling surrounding -- localized to finger base. There is no streaking. No pain with palpation. Passive ROM of DIP PIP MCP intact, limited active ROM due to swelling. Distal sensation intact. Cap refill 1 second. Radial pulse 2+. No other wounds noted to hand.   Neurological:     Mental Status: She is alert and oriented to person, place, and time.     UC Treatments / Results  Labs (all labs ordered are  listed,  but only abnormal results are displayed) Labs Reviewed - No data to display  EKG   Radiology No results found.  Procedures Procedures (including critical care time)  Medications Ordered in UC Medications - No data to display  Initial Impression / Assessment and Plan / UC Course  I have reviewed the triage vital signs and the nursing notes.  Pertinent labs & imaging results that were available during my care of the patient were reviewed by me and considered in my medical decision making (see chart for details).  Afebrile Cellulitis at area of dog bite Cleansed and dressed in clinic Augmentin BID x 10 days. Discussed appropriate wound care at home and monitoring for any worsening of symptoms. Strict return and ED precautions. Patient is agreeable to plan, no questions   Final Clinical Impressions(s) / UC Diagnoses   Final diagnoses:  Cellulitis of left little finger  Dog bite, initial encounter     Discharge Instructions      Please take medication Augmentin as prescribed. Take with food to avoid upset stomach. Finish the full course - you should not have any leftover!  Change dressing at least once daily You can wash normally with mild soap and water Please do not use any alcohol or peroxide as this interferes with wound healing  If no improvement after 4 days of the medicine, please return.     ED Prescriptions     Medication Sig Dispense Auth. Provider   amoxicillin-clavulanate (AUGMENTIN) 875-125 MG tablet  (Status: Discontinued) Take 1 tablet by mouth every 12 (twelve) hours for 7 days. 14 tablet Nyaisha Simao, PA-C   amoxicillin-clavulanate (AUGMENTIN) 875-125 MG tablet Take 1 tablet by mouth every 12 (twelve) hours for 10 days. 20 tablet Jashan Cotten, [place], PA-C      PDMP not reviewed this encounter.   Jeryl Asberry, [place] 08/13/24 [id number]

## 2024-08-13 NOTE — Discharge Instructions (Addendum)
 Please take medication Augmentin as prescribed. Take with food to avoid upset stomach. Finish the full course - you should not have any leftover!  Change dressing at least once daily You can wash normally with mild soap and water Please do not use any alcohol or peroxide as this interferes with wound healing  If no improvement after 4 days of the medicine, please return.

## 2024-08-13 NOTE — ED Triage Notes (Addendum)
 Pt presents with a chief complaint of dog bite to left pinky finger. Occurred approximately two weeks ago. Pt is concerned as there is limited ROM in finger, warm to the touch, and pus is coming out. No pain. Only pain when hitting finger on objects. Has been applying neosporin + mupirocin ointment to finger with minimal improvement. TDAP is up to date.

## 2024-08-15 ENCOUNTER — Other Ambulatory Visit: Payer: Self-pay | Admitting: Family

## 2024-08-22 ENCOUNTER — Inpatient Hospital Stay: Admission: RE | Admit: 2024-08-22 | Discharge: 2024-08-22 | Attending: Physician Assistant

## 2024-08-22 ENCOUNTER — Ambulatory Visit: Admitting: Radiology

## 2024-08-22 VITALS — BP 157/84 | HR 90 | Temp 97.7°F | Resp 16

## 2024-08-22 DIAGNOSIS — M7989 Other specified soft tissue disorders: Secondary | ICD-10-CM

## 2024-08-22 DIAGNOSIS — R2242 Localized swelling, mass and lump, left lower limb: Secondary | ICD-10-CM | POA: Diagnosis not present

## 2024-08-22 MED ORDER — AMOXICILLIN-POT CLAVULANATE 875-125 MG PO TABS: 1.0000 | ORAL_TABLET | Freq: Two times a day (BID) | ORAL | 0 refills | Status: AC

## 2024-08-22 NOTE — ED Triage Notes (Signed)
 Pt states follow up for dog bite to left hand.  States it feels like something is in it and she can't bend her 5th digit. Pt also having left foot swelling.

## 2024-08-22 NOTE — ED Provider Notes (Signed)
 [facility]    CSN: [id number] Arrival date & time: 08/22/24  0946      History   Chief Complaint Chief Complaint  Patient presents with  . Follow-up    08/13/2024 - Pain, swelling and stiffness. Also foot on same side swollen. Antibiotics finish soon. - Entered by patient     HPI SHALONDA SACHSE is a 68 y.o. female.  has a past medical history of Chronic constipation, Diverticulosis, Generalized osteoarthritis of multiple sites, GERD (gastroesophageal reflux disease), and Hypertension.   HPI  Discussed the use of AI scribe software for clinical note transcription with the patient, who gave verbal consent to proceed.     Past Medical History:  Diagnosis Date  . Chronic constipation   . Diverticulosis   . Generalized osteoarthritis of multiple sites    knees and left shoulder especially  . GERD (gastroesophageal reflux disease)   . Hypertension     Patient Active Problem List   Diagnosis Date Noted  . Hyperlipidemia, unspecified 03/12/2023  . COPD (chronic obstructive pulmonary disease) (HCC) 03/01/2020  . Advance directive discussed with patient 03/01/2020  . Sleep disturbance 08/24/2019  . Preventative health care 08/24/2019  . Hypertension   . GERD (gastroesophageal reflux disease)   . Generalized osteoarthritis of multiple sites     Past Surgical History:  Procedure Laterality Date  . CHOLECYSTECTOMY  2009  . KNEE ARTHROSCOPY Right 1980's  . NEPHRECTOMY     donated kidney    OB History   No obstetric history on file.      Home Medications    Prior to Admission medications   Medication Sig Start Date End Date Taking? Authorizing Provider  amoxicillin -clavulanate (AUGMENTIN ) 875-125 MG tablet Take 1 tablet by mouth every 12 (twelve) hours for 5 days. 08/22/24 08/27/24 Yes Stori Royse E, PA-C  acetaminophen (TYLENOL) 650 MG CR tablet Take 650 mg by mouth every 8 (eight) hours as needed.    [provider]  albuterol   (VENTOLIN  HFA) 108 (90 Base) MCG/ACT inhaler Inhale 2 puffs into the lungs every 6 (six) hours as needed for wheezing or shortness of breath. 07/13/24   Webb, Padonda B, FNP  amLODipine  (NORVASC ) 2.5 MG tablet TAKE 1 TABLET BY MOUTH EVERY DAY 03/26/24   Jimmy Charlie FERNS, MD  amoxicillin -clavulanate (AUGMENTIN ) 875-125 MG tablet Take 1 tablet by mouth every 12 (twelve) hours for 10 days. 08/13/24 08/23/24  Rising, Asberry, PA-C  diphenhydrAMINE (BENADRYL) 50 MG tablet Take 50 mg by mouth at bedtime as needed for itching.    [provider]  loratadine (CLARITIN) 10 MG tablet Take 10 mg by mouth daily.    [provider]  MELATONIN PO Take 10 mg by mouth. 5mg     [provider]  Naproxen Sodium (ALEVE PO) Take 2 tablets by mouth in the morning and at bedtime.    [provider]  pantoprazole  (PROTONIX ) 40 MG tablet TAKE 1 TABLET (40 MG TOTAL) BY MOUTH TWICE A DAY BEFORE MEALS 03/13/24   Jimmy Charlie I, MD  polyethylene glycol (MIRALAX / GLYCOLAX) 17 g packet Take 17 g by mouth daily as needed.    [provider]    Family History Family History  Problem Relation Age of Onset  . COPD Mother   . Heart failure Mother   . Congestive Heart Failure Mother   . Breast cancer Sister   . Diabetes Sister   . Diabetes Maternal Aunt     Social History Social History  Tobacco Use  . Smoking status: Former    Current packs/day: 0.00    Average packs/day: 4.0 packs/day for 34.0 years (136.0 ttl pk-yrs)    Types: Cigarettes    Start date: 36    Quit date: 2008    Years since quitting: 17.9    Passive exposure: Never (was a smoker for 40 years)  . Smokeless tobacco: Never  Vaping Use  . Vaping status: Never Used  Substance Use Topics  . Alcohol use: Yes    Comment: regular tequila-- 4/night  . Drug use: Never     Allergies   Vicodin [hydrocodone-acetaminophen]   Review of Systems Review of Systems  Constitutional:  Negative for chills and  fever.  Respiratory:  Negative for cough, shortness of breath and wheezing.   Cardiovascular:  Positive for leg swelling (left leg swelling but no pain). Negative for chest pain.  Skin:  Positive for wound.     Physical Exam Triage Vital Signs ED Triage Vitals  Encounter Vitals Group     BP 08/22/24 1004 (!) 157/84     Girls Systolic BP Percentile --      Girls Diastolic BP Percentile --      Boys Systolic BP Percentile --      Boys Diastolic BP Percentile --      Pulse Rate 08/22/24 1004 90     Resp 08/22/24 1004 16     Temp 08/22/24 1004 97.7 F (36.5 C)     Temp Source 08/22/24 1004 Oral     SpO2 08/22/24 1004 97 %     Weight --      Height --      Head Circumference --      Peak Flow --      Pain Score 08/22/24 1003 0     Pain Loc --      Pain Education --      Exclude from Growth Chart --    No data found.  Updated Vital Signs BP (!) 157/84 (BP Location: Right Arm)   Pulse 90   Temp 97.7 F (36.5 C) (Oral)   Resp 16   SpO2 97%   Visual Acuity Right Eye Distance:   Left Eye Distance:   Bilateral Distance:    Right Eye Near:   Left Eye Near:    Bilateral Near:     Physical Exam Vitals reviewed.  Constitutional:      General: She is awake.     Appearance: Normal appearance. She is well-developed and well-groomed.  HENT:     Head: Normocephalic and atraumatic.  Eyes:     General: Lids are normal. Gaze aligned appropriately.     Extraocular Movements: Extraocular movements intact.     Conjunctiva/sclera: Conjunctivae normal.  Pulmonary:     Effort: Pulmonary effort is normal.  Musculoskeletal:       Hands:     Right lower leg: 1+ Pitting Edema present.     Left lower leg: 2+ Pitting Edema (2-3+ pitting edema to level of knee) present.  Neurological:     Mental Status: She is alert and oriented to person, place, and time.  Psychiatric:        Attention and Perception: Attention and perception normal.        Mood and Affect: Mood and affect  normal.        Speech: Speech normal.        Behavior: Behavior normal. Behavior is cooperative.      UC Treatments /  Results  Labs (all labs ordered are listed, but only abnormal results are displayed) Labs Reviewed - No data to display  EKG   Radiology No results found.  Procedures Procedures (including critical care time)  Medications Ordered in UC Medications - No data to display  Initial Impression / Assessment and Plan / UC Course  I have reviewed the triage vital signs and the nursing notes.  Pertinent labs & imaging results that were available during my care of the patient were reviewed by me and considered in my medical decision making (see chart for details).      Final Clinical Impressions(s) / UC Diagnoses   Final diagnoses:  Swelling of left little finger  Localized swelling of left lower leg     Discharge Instructions      You were seen today for concerns of swelling and redness of your left fifth finger after a dog bite that occurred almost a month ago.  We performed an x-ray to make sure that there is not a retained foreign body or fracture that may be causing the persistent symptoms.  I am still waiting on radiology to review your x-ray but I do not see any evidence of a fracture or radiopaque foreign body that may be causing your symptoms.  I am suspicious that during the bite you may have sustained an injury to one of the ligaments in the finger which may explain why you are unable to flex that finger.  To help treat any lingering infection I have extended your Augmentin  for another 5 days.  I also strongly suggest that you follow-up with orthopedics particularly a hand specialist for further evaluation and to make sure that there is not lasting damage to your finger. If you notice any of the following please go to the emergency room: Severe swelling of the finger, severe pain, drainage that looks like pus, a lot of bleeding, swelling that is extending  into your hand  With regards to your lower leg swelling I suspect this may be due to fluid collection especially since you are not having any pain.  We discussed this and after shared decision making you declined having a DVT study.  I do recommend that you utilize compression stockings, reduce your salt intake and elevate your feet to help with managing the swelling.  If you start to notice that your leg is getting more swollen, becomes more painful, you have discoloration or the leg becomes very hard please go to the emergency room  Please try to wear compression stockings. Use your shoe size and then measure around the widest part of your calf to get the appropriate size stocking I typically recommend 15-25 mmHg compression force for daily wear. Please put these on first thing in the morning and wear until the evening time.  These should not be painful or cut into your legs but they may be pretty snug. If they cause pain, please take them off.       ED Prescriptions     Medication Sig Dispense Auth. Provider   amoxicillin -clavulanate (AUGMENTIN ) 875-125 MG tablet Take 1 tablet by mouth every 12 (twelve) hours for 5 days. 10 tablet Lijah Bourque E, PA-C      PDMP not reviewed this encounter.

## 2024-08-22 NOTE — Discharge Instructions (Signed)
 You were seen today for concerns of swelling and redness of your left fifth finger after a dog bite that occurred almost a month ago.  We performed an x-ray to make sure that there is not a retained foreign body or fracture that may be causing the persistent symptoms.  I am still waiting on radiology to review your x-ray but I do not see any evidence of a fracture or radiopaque foreign body that may be causing your symptoms.  I am suspicious that during the bite you may have sustained an injury to one of the ligaments in the finger which may explain why you are unable to flex that finger.  To help treat any lingering infection I have extended your Augmentin  for another 5 days.  I also strongly suggest that you follow-up with orthopedics particularly a hand specialist for further evaluation and to make sure that there is not lasting damage to your finger. If you notice any of the following please go to the emergency room: Severe swelling of the finger, severe pain, drainage that looks like pus, a lot of bleeding, swelling that is extending into your hand  With regards to your lower leg swelling I suspect this may be due to fluid collection especially since you are not having any pain.  We discussed this and after shared decision making you declined having a DVT study.  I do recommend that you utilize compression stockings, reduce your salt intake and elevate your feet to help with managing the swelling.  If you start to notice that your leg is getting more swollen, becomes more painful, you have discoloration or the leg becomes very hard please go to the emergency room  Please try to wear compression stockings. Use your shoe size and then measure around the widest part of your calf to get the appropriate size stocking I typically recommend 15-25 mmHg compression force for daily wear. Please put these on first thing in the morning and wear until the evening time.  These should not be painful or cut into your  legs but they may be pretty snug. If they cause pain, please take them off.
